# Patient Record
Sex: Female | Born: 1990 | Race: White | Hispanic: No | Marital: Married | Smoking: Never smoker
Health system: Southern US, Community
[De-identification: ages and names within clinical notes are randomized; demographics above are authoritative.]

## PROBLEM LIST (undated history)

## (undated) ENCOUNTER — Emergency Department: Admission: EM | Payer: Medicaid Other | Source: Home / Self Care

## (undated) DIAGNOSIS — K219 Gastro-esophageal reflux disease without esophagitis: Secondary | ICD-10-CM

## (undated) DIAGNOSIS — F32A Depression, unspecified: Secondary | ICD-10-CM

## (undated) DIAGNOSIS — E162 Hypoglycemia, unspecified: Secondary | ICD-10-CM

## (undated) DIAGNOSIS — J45909 Unspecified asthma, uncomplicated: Secondary | ICD-10-CM

## (undated) DIAGNOSIS — F329 Major depressive disorder, single episode, unspecified: Secondary | ICD-10-CM

## (undated) HISTORY — DX: Gastro-esophageal reflux disease without esophagitis: K21.9

## (undated) HISTORY — PX: TONSILLECTOMY AND ADENOIDECTOMY: SUR1326

## (undated) HISTORY — DX: Depression, unspecified: F32.A

## (undated) HISTORY — DX: Hypoglycemia, unspecified: E16.2

## (undated) HISTORY — DX: Unspecified asthma, uncomplicated: J45.909

---
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
## 2017-04-21 ENCOUNTER — Ambulatory Visit (INDEPENDENT_AMBULATORY_CARE_PROVIDER_SITE_OTHER): Payer: BLUE CROSS/BLUE SHIELD | Admitting: Advanced Practice Midwife

## 2017-04-21 ENCOUNTER — Encounter: Payer: Self-pay | Admitting: Advanced Practice Midwife

## 2017-04-21 ENCOUNTER — Encounter: Payer: BLUE CROSS/BLUE SHIELD | Admitting: Obstetrics and Gynecology

## 2017-04-21 VITALS — BP 100/60 | Ht 63.0 in | Wt 153.0 lb

## 2017-04-21 DIAGNOSIS — Z124 Encounter for screening for malignant neoplasm of cervix: Secondary | ICD-10-CM | POA: Diagnosis not present

## 2017-04-21 DIAGNOSIS — Z3401 Encounter for supervision of normal first pregnancy, first trimester: Secondary | ICD-10-CM | POA: Diagnosis not present

## 2017-04-21 DIAGNOSIS — Z113 Encounter for screening for infections with a predominantly sexual mode of transmission: Secondary | ICD-10-CM

## 2017-04-21 DIAGNOSIS — Z3400 Encounter for supervision of normal first pregnancy, unspecified trimester: Secondary | ICD-10-CM | POA: Insufficient documentation

## 2017-04-21 NOTE — Progress Notes (Signed)
C/o hypoglycemic - bs have been everywhere since preg. Eating a lot. Really thirsty and not using the bathroom a lot. Every day around 2:30 face flushes - thinks it's her blood sugar dropping. rj

## 2017-04-21 NOTE — Patient Instructions (Signed)
Prenatal Care WHAT IS PRENATAL CARE? Prenatal care is the process of caring for a pregnant woman before she gives birth. Prenatal care makes sure that she and her baby remain as healthy as possible throughout pregnancy. Prenatal care may be provided by a midwife, family practice health care provider, or a childbirth and pregnancy specialist (obstetrician). Prenatal care may include physical examinations, testing, treatments, and education on nutrition, lifestyle, and social support services. WHY IS PRENATAL CARE SO IMPORTANT? Early and consistent prenatal care increases the chance that you and your baby will remain healthy throughout your pregnancy. This type of care also decreases a baby's risk of being born too early (prematurely), or being born smaller than expected (small for gestational age). Any underlying medical conditions you may have that could pose a risk during your pregnancy are discussed during prenatal care visits. You will also be monitored regularly for any new conditions that may arise during your pregnancy so they can be treated quickly and effectively. WHAT HAPPENS DURING PRENATAL CARE VISITS? Prenatal care visits may include the following: Discussion Tell your health care provider about any new signs or symptoms you have experienced since your last visit. These might include:  Nausea or vomiting.  Increased or decreased level of energy.  Difficulty sleeping.  Back or leg pain.  Weight changes.  Frequent urination.  Shortness of breath with physical activity.  Changes in your skin, such as the development of a rash or itchiness.  Vaginal discharge or bleeding.  Feelings of excitement or nervousness.  Changes in your baby's movements.  You may want to write down any questions or topics you want to discuss with your health care provider and bring them with you to your appointment. Examination During your first prenatal care visit, you will likely have a complete  physical exam. Your health care provider will often examine your vagina, cervix, and the position of your uterus, as well as check your heart, lungs, and other body systems. As your pregnancy progresses, your health care provider will measure the size of your uterus and your baby's position inside your uterus. He or she may also examine you for early signs of labor. Your prenatal visits may also include checking your blood pressure and, after about 10-12 weeks of pregnancy, listening to your baby's heartbeat. Testing Regular testing often includes:  Urinalysis. This checks your urine for glucose, protein, or signs of infection.  Blood count. This checks the levels of white and red blood cells in your body.  Tests for sexually transmitted infections (STIs). Testing for STIs at the beginning of pregnancy is routinely done and is required in many states.  Antibody testing. You will be checked to see if you are immune to certain illnesses, such as rubella, that can affect a developing fetus.  Glucose screen. Around 24-28 weeks of pregnancy, your blood glucose level will be checked for signs of gestational diabetes. Follow-up tests may be recommended.  Group B strep. This is a bacteria that is commonly found inside a woman's vagina. This test will inform your health care provider if you need an antibiotic to reduce the amount of this bacteria in your body prior to labor and childbirth.  Ultrasound. Many pregnant women undergo an ultrasound screening around 18-20 weeks of pregnancy to evaluate the health of the fetus and check for any developmental abnormalities.  HIV (human immunodeficiency virus) testing. Early in your pregnancy, you will be screened for HIV. If you are at high risk for HIV, this test may   be repeated during your third trimester of pregnancy.  You may be offered other testing based on your age, personal or family medical history, or other factors. HOW OFTEN SHOULD I PLAN TO SEE MY  HEALTH CARE PROVIDER FOR PRENATAL CARE? Your prenatal care check-up schedule depends on any medical conditions you have before, or develop during, your pregnancy. If you do not have any underlying medical conditions, you will likely be seen for checkups:  Monthly, during the first 6 months of pregnancy.  Twice a month during months 7 and 8 of pregnancy.  Weekly starting in the 9th month of pregnancy and until delivery.  If you develop signs of early labor or other concerning signs or symptoms, you may need to see your health care provider more often. Ask your health care provider what prenatal care schedule is best for you. WHAT CAN I DO TO KEEP MYSELF AND MY BABY AS HEALTHY AS POSSIBLE DURING MY PREGNANCY?  Take a prenatal vitamin containing 400 micrograms (0.4 mg) of folic acid every day. Your health care provider may also ask you to take additional vitamins such as iodine, vitamin D, iron, copper, and zinc.  Take 1500-2000 mg of calcium daily starting at your 20th week of pregnancy until you deliver your baby.  Make sure you are up to date on your vaccinations. Unless directed otherwise by your health care provider: ? You should receive a tetanus, diphtheria, and pertussis (Tdap) vaccination between the 27th and 36th week of your pregnancy, regardless of when your last Tdap immunization occurred. This helps protect your baby from whooping cough (pertussis) after he or she is born. ? You should receive an annual inactivated influenza vaccine (IIV) to help protect you and your baby from influenza. This can be done at any point during your pregnancy.  Eat a well-rounded diet that includes: ? Fresh fruits and vegetables. ? Lean proteins. ? Calcium-rich foods such as milk, yogurt, hard cheeses, and dark, leafy greens. ? Whole grain breads.  Do noteat seafood high in mercury, including: ? Swordfish. ? Tilefish. ? Shark. ? King mackerel. ? More than 6 oz tuna per week.  Do not  eat: ? Raw or undercooked meats or eggs. ? Unpasteurized foods, such as soft cheeses (brie, blue, or feta), juices, and milks. ? Lunch meats. ? Hot dogs that have not been heated until they are steaming.  Drink enough water to keep your urine clear or pale yellow. For many women, this may be 10 or more 8 oz glasses of water each day. Keeping yourself hydrated helps deliver nutrients to your baby and may prevent the start of pre-term uterine contractions.  Do not use any tobacco products including cigarettes, chewing tobacco, or electronic cigarettes. If you need help quitting, ask your health care provider.  Do not drink beverages containing alcohol. No safe level of alcohol consumption during pregnancy has been determined.  Do not use any illegal drugs. These can harm your developing baby or cause a miscarriage.  Ask your health care provider or pharmacist before taking any prescription or over-the-counter medicines, herbs, or supplements.  Limit your caffeine intake to no more than 200 mg per day.  Exercise. Unless told otherwise by your health care provider, try to get 30 minutes of moderate exercise most days of the week. Do not  do high-impact activities, contact sports, or activities with a high risk of falling, such as horseback riding or downhill skiing.  Get plenty of rest.  Avoid anything that raises your   body temperature, such as hot tubs and saunas.  If you own a cat, do not empty its litter box. Bacteria contained in cat feces can cause an infection called toxoplasmosis. This can result in serious harm to the fetus.  Stay away from chemicals such as insecticides, lead, mercury, and cleaning or paint products that contain solvents.  Do not have any X-rays taken unless medically necessary.  Take a childbirth and breastfeeding preparation class. Ask your health care provider if you need a referral or recommendation.  This information is not intended to replace advice given  to you by your health care provider. Make sure you discuss any questions you have with your health care provider. Document Released: 03/26/2003 Document Revised: 08/26/2015 Document Reviewed: 06/07/2013 Elsevier Interactive Patient Education  2017 [employer] Exercise During Pregnancy For people of all ages, exercise is an important part of being healthy. Exercise improves heart and lung function and helps to maintain strength, flexibility, and a healthy body weight. Exercise also boosts energy levels and elevates mood. For most women, maintaining an exercise routine throughout pregnancy is recommended. It is only on rare occasions and with certain medical conditions or pregnancy complications that women may be asked to limit or avoid exercise during pregnancy. What are some other benefits to exercising during pregnancy? Along with maintaining strength and flexibility, exercising throughout pregnancy can help to:  Keep strength in muscles that are very important during labor and childbirth.  Decrease low back pain during pregnancy.  Decrease the risk of developing gestational diabetes mellitus (GDM).  Improve blood sugar (glucose) control for women who have GDM.  Decrease the risk of developing preeclampsia. This is a serious condition that causes high blood pressure along with other symptoms, such as swelling and headaches.  Decrease the risk of cesarean delivery.  Speed up the recovery after giving birth.  How often should I exercise? Unless your health care provider gives you different instructions, you should try to exercise on most days or all days of the week. In general, try to exercise with moderate intensity for about 150 minutes per week. This can be spread out across several days, such as exercising for 30 minutes per day on 5 days of each week. You can tell that you are exercising at a moderate intensity if you have a higher heart rate and faster breathing, but you are still  able to hold a conversation. What types of moderate-intensity exercise are recommended during pregnancy? There are many types of exercise that are safe for you to do during pregnancy. Unless your health care provider gives you different instructions, do a variety of exercises that safely increase your heart and breathing (cardiopulmonary) rates and help you to build and maintain muscle strength (strength training). You should always be able to talk in full sentences while exercising during pregnancy. Some examples of exercising that is safe to do during pregnancy include:  Brisk walking or hiking.  Swimming.  Water aerobics.  Riding a stationary bike.  Strength training.  Modified yoga or Pilates. Tell your instructor that you are pregnant. Avoid overstretching and avoid lying on your back for long periods of time.  Running or jogging. Only choose this type of exercise if: ? You ran or jogged regularly before your pregnancy. ? You can run or jog and still talk in complete sentences.  What types of exercise should I not do during pregnancy? Depending on your level of fitness and whether you exercised regularly before your pregnancy, you may be   advised to limit vigorous-intensity exercise during your pregnancy. You can tell that you are exercising at a vigorous intensity if you are breathing much harder and faster and cannot hold a conversation while exercising. Some examples of exercising that you should avoid during pregnancy include:  Contact sports.  Activities that place you at risk for falling on or being hit in the belly, such as downhill skiing, water skiing, surfing, rock climbing, cycling, gymnastics, and horseback riding.  Scuba diving.  Sky diving.  Yoga or Pilates in a room that is heated to extreme temperatures ("hot yoga" or "hot Pilates").  Jogging or running, unless you ran or jogged regularly before your pregnancy. While jogging or running, you should always be able  to talk in full sentences. Do not run or jog so vigorously that you are unable to have a conversation.  If you are not used to exercising at elevation (more than 6,000 feet above sea level), do not do so during your pregnancy.  When should I avoid exercising during pregnancy? Certain medical conditions can make it unsafe to exercise during pregnancy, or they may increase your risk of miscarriage or early labor and birth. Some of these conditions include:  Some types of heart disease.  Some types of lung disease.  Placenta previa. This is when the placenta partially or completely covers the opening of the uterus (cervix).  Frequent bleeding from the vagina during your pregnancy.  Incompetent cervix. This is when your cervix does not remain as tightly closed during pregnancy as it should.  Premature labor.  Ruptured membranes. This is when the protective sac (amniotic sac) opens up and amniotic fluid leaks from your vagina.  Severely low blood count (anemia).  Preeclampsia or pregnancy-caused high blood pressure.  Carrying more than one baby (multiple gestation) and having an additional risk of early labor.  Poorly controlled diabetes.  Being severely underweight or severely overweight.  Intrauterine growth restriction. This is when your baby's growth and development during pregnancy are slower than expected.  Other medical conditions. Ask your health care provider if any apply to you.  What else should I know about exercising during pregnancy? You should take these precautions while exercising during pregnancy:  Avoid overheating. ? Wear loose-fitting, breathable clothes. ? Do not exercise in very high temperatures.  Avoid dehydration. Drink enough water before, during, and after exercise to keep your urine clear or pale yellow.  Avoid overstretching. Because of hormone changes during pregnancy, it is easy to overstretch muscles, tendons, and ligaments during  pregnancy.  Start slowly and ask your health care provider to recommend types of exercise that are safe for you, if exercising regularly is new for you.  Pregnancy is not a time for exercising to lose weight. When should I seek medical care? You should stop exercising and call your health care provider if you have any unusual symptoms, such as:  Mild uterine contractions or abdominal cramping.  Dizziness that does not improve with rest.  When should I seek immediate medical care? You should stop exercising and call your local emergency services (911 in the [place] if you have any unusual symptoms, such as:  Sudden, severe pain in your low back or your belly.  Uterine contractions or abdominal cramping that do not improve with rest.  Chest pain.  Bleeding or fluid leaking from your vagina.  Shortness of breath.  This information is not intended to replace advice given to you by your health care provider. Make sure you discuss any   questions you have with your health care provider. Document Released: 03/23/2005 Document Revised: 08/21/2015 Document Reviewed: 05/31/2014 [employer] Interactive Patient Education  2018 [employer] Eating Plan for Pregnant Women While you are pregnant, your body will require additional nutrition to help support your growing baby. It is recommended that you consume:  150 additional calories each day during your first trimester.  300 additional calories each day during your second trimester.  300 additional calories each day during your third trimester.  Eating a healthy, well-balanced diet is very important for your health and for your baby's health. You also have a higher need for some vitamins and minerals, such as folic acid, calcium, iron, and vitamin D. What do I need to know about eating during pregnancy?  Do not try to lose weight or go on a diet during pregnancy.  Choose healthy, nutritious foods. Choose  of a sandwich with a glass of milk  instead of a candy bar or a high-calorie sugar-sweetened beverage.  Limit your overall intake of foods that have "empty calories." These are foods that have little nutritional value, such as sweets, desserts, candies, sugar-sweetened beverages, and fried foods.  Eat a variety of foods, especially fruits and vegetables.  Take a prenatal vitamin to help meet the additional needs during pregnancy, specifically for folic acid, iron, calcium, and vitamin D.  Remember to stay active. Ask your health care provider for exercise recommendations that are specific to you.  Practice good food safety and cleanliness, such as washing your hands before you eat and after you prepare raw meat. This helps to prevent foodborne illnesses, such as listeriosis, that can be very dangerous for your baby. Ask your health care provider for more information about listeriosis. What does 150 extra calories look like? Healthy options for an additional 150 calories each day could be any of the following:  Plain low-fat yogurt (6-8 oz) with  cup of berries.  1 apple with 2 teaspoons of peanut butter.  Cut-up vegetables with  cup of hummus.  Low-fat chocolate milk (8 oz or 1 cup).  1 string cheese with 1 medium orange.   of a peanut butter and jelly sandwich on whole-wheat bread (1 tsp of peanut butter).  For 300 calories, you could eat two of those healthy options each day. What is a healthy amount of weight to gain? The recommended amount of weight for you to gain is based on your pre-pregnancy BMI. If your pre-pregnancy BMI was:  Less than 18 (underweight), you should gain 28-40 lb.  18-24.9 (normal), you should gain 25-35 lb.  25-29.9 (overweight), you should gain 15-25 lb.  Greater than 30 (obese), you should gain 11-20 lb.  What if I am having twins or multiples? Generally, pregnant women who will be having twins or multiples may need to increase their daily calories by 300-600 calories each day. The  recommended range for total weight gain is 25-54 lb, depending on your pre-pregnancy BMI. Talk with your health care provider for specific guidance about additional nutritional needs, weight gain, and exercise during your pregnancy. What foods can I eat? Grains Any grains. Try to choose whole grains, such as whole-wheat bread, oatmeal, or brown rice. Vegetables Any vegetables. Try to eat a variety of colors and types of vegetables to get a full range of vitamins and minerals. Remember to wash your vegetables well before eating. Fruits Any fruits. Try to eat a variety of colors and types of fruit to get a full range of vitamins and   minerals. Remember to wash your fruits well before eating. Meats and Other Protein Sources Lean meats, including chicken, turkey, fish, and lean cuts of beef, veal, or pork. Make sure that all meats are cooked to "well done." Tofu. Tempeh. Beans. Eggs. Peanut butter and other nut butters. Seafood, such as shrimp, crab, and lobster. If you choose fish, select types that are higher in omega-3 fatty acids, including salmon, herring, mussels, trout, sardines, and pollock. Make sure that all meats are cooked to food-safe temperatures. Dairy Pasteurized milk and milk alternatives. Pasteurized yogurt and pasteurized cheese. Cottage cheese. Sour cream. Beverages Water. Juices that contain 100% fruit juice or vegetable juice. Caffeine-free teas and decaffeinated coffee. Drinks that contain caffeine are okay to drink, but it is better to avoid caffeine. Keep your total caffeine intake to less than 200 mg each day (12 oz of coffee, tea, or soda) or as directed by your health care provider. Condiments Any pasteurized condiments. Sweets and Desserts Any sweets and desserts. Fats and Oils Any fats and oils. The items listed above may not be a complete list of recommended foods or beverages. Contact your dietitian for more options. What foods are not  recommended? Vegetables Unpasteurized (raw) vegetable juices. Fruits Unpasteurized (raw) fruit juices. Meats and Other Protein Sources Cured meats that have nitrates, such as bacon, salami, and hotdogs. Luncheon meats, bologna, or other deli meats (unless they are reheated until they are steaming hot). Refrigerated pate, meat spreads from a meat counter, smoked seafood that is found in the refrigerated section of a store. Raw fish, such as sushi or sashimi. High mercury content fish, such as tilefish, shark, swordfish, and king mackerel. Raw meats, such as tuna or beef tartare. Undercooked meats and poultry. Make sure that all meats are cooked to food-safe temperatures. Dairy Unpasteurized (raw) milk and any foods that have raw milk in them. Soft cheeses, such as feta, queso blanco, queso fresco, Brie, Camembert cheeses, blue-veined cheeses, and Panela cheese (unless it is made with pasteurized milk, which must be stated on the label). Beverages Alcohol. Sugar-sweetened beverages, such as sodas, teas, or energy drinks. Condiments Homemade fermented foods and drinks, such as pickles, sauerkraut, or kombucha drinks. (Store-bought pasteurized versions of these are okay.) Other Salads that are made in the store, such as ham salad, chicken salad, egg salad, tuna salad, and seafood salad. The items listed above may not be a complete list of foods and beverages to avoid. Contact your dietitian for more information. This information is not intended to replace advice given to you by your health care provider. Make sure you discuss any questions you have with your health care provider. Document Released: 01/05/2014 Document Revised: 08/29/2015 Document Reviewed: 09/05/2013 [employer] Interactive Patient Education  2018 [employer]  

## 2017-04-22 LAB — RPR+RH+ABO+RUB AB+AB SCR+CB...
Antibody Screen: NEGATIVE
HEMATOCRIT: 37.7 % (ref 34.0–46.6)
HEP B S AG: NEGATIVE
HIV SCREEN 4TH GENERATION: NONREACTIVE
Hemoglobin: 12.1 g/dL (ref 11.1–15.9)
MCH: 27.5 pg (ref 26.6–33.0)
MCHC: 32.1 g/dL (ref 31.5–35.7)
MCV: 86 fL (ref 79–97)
PLATELETS: 160 10*3/uL (ref 150–379)
RBC: 4.4 x10E6/uL (ref 3.77–5.28)
RDW: 13.2 % (ref 12.3–15.4)
RH TYPE: POSITIVE
RPR: NONREACTIVE
Rubella Antibodies, IGG: 7.06 index (ref >0.99)
Varicella zoster IgG: 3033 index (ref >165)
WBC: 9.5 10*3/uL (ref 3.4–10.8)

## 2017-04-22 LAB — URINE CULTURE

## 2017-04-22 NOTE — Progress Notes (Signed)
New Obstetric Patient H&P    Chief Complaint: "Desires prenatal care"   History of Present Illness: Patient is a 27 y.o. G1P0 Not Hispanic or Latino female, LMP 03/04/2017 presents with amenorrhea and positive home pregnancy test. Based on her LMP, her EDD is Estimated Date of Delivery: 12/09/17 and her EGA is [redacted]w[redacted]d. Cycles are 6. days, regular, and occur approximately every : 28 days. She has never had a PAP smear.   She had a urine pregnancy test which was positive 2 week(s)  ago. Her last menstrual period was normal and lasted for  5 or 6 day(s). Since her LMP she claims she has experienced breast tenderness, fatigue, nausea. She denies vaginal bleeding. Her past medical history is contributory for asthma and hypoglycemia. This is her first pregnancy.  Since her LMP, she admits to the use of tobacco products  no She claims she has gained  5 pounds since the start of her pregnancy.  There are cats in the home in the home  no  She admits close contact with children on a regular basis  no  She has had chicken pox in the past -she was vaccinated She has had Tuberculosis exposures, symptoms, or previously tested positive for TB   no Current or past history of domestic violence. no  Genetic Screening/Teratology Counseling: (Includes patient, baby's father, or anyone in either family with:)   1. Patient's age >/= 65 at [facility]  no 2. Thalassemia ([place], [place], Mediterranean, or Asian background): MCV<80  no 3. Neural tube defect (meningomyelocele, spina bifida, anencephaly)  no 4. Congenital heart defect  no  5. Down syndrome  no 6. Tay-Sachs (Jewish, [place]  no 7. Canavan's Disease  no 8. Sickle cell disease or trait (African)  no  9. Hemophilia or other blood disorders  no  10. Muscular dystrophy  no  11. Cystic fibrosis  no  12. Huntington's Chorea  no  13. Mental retardation/autism  no 14. Other inherited genetic or chromosomal disorder  no 15. Maternal metabolic disorder  (DM, PKU, etc)  no 16. Patient or FOB with a child with a birth defect not listed above no  16a. Patient or FOB with a birth defect themselves no 17. Recurrent pregnancy loss, or stillbirth  no  18. Any medications since LMP other than prenatal vitamins (include vitamins, supplements, OTC meds, drugs, alcohol)  Asthma, allergy and GERD medications 19. Any other genetic/environmental exposure to discuss  no  Infection History:   1. Lives with someone with TB or TB exposed  no  2. Patient or partner has history of genital herpes  no 3. Rash or viral illness since LMP  no 4. History of STI (GC, CT, HPV, syphilis, HIV)  no 5. History of recent travel :  no  Other pertinent information:  no     Review of Systems:10 point review of systems negative unless otherwise noted in HPI  Past Medical History:  Past Medical History:  Diagnosis Date  . Acid reflux   . Asthma   . Hypoglycemia     Past Surgical History:  Past Surgical History:  Procedure Laterality Date  . TONSILLECTOMY AND ADENOIDECTOMY    . WISDOM TOOTH EXTRACTION  2013    Gynecologic History: Patient's last menstrual period was 03/04/2017 (approximate).  Obstetric History: G1P0  Family History:  Family History  Problem Relation Age of Onset  . Cancer Mother 20       thyroid  . Thyroid disease Mother   .  Cancer Father [id number]       appendix - ca caused them to rupture  . Cancer Paternal Grandmother 4160       cervical    Social History:  Social History   Socioeconomic History  . Marital status: Married    Spouse name: Not on file  . Number of children: 0  . Years of education: 6314  . Highest education level: Not on file  Social Needs  . Financial resource strain: Not on file  . Food insecurity - worry: Not on file  . Food insecurity - inability: Not on file  . Transportation needs - medical: Not on file  . Transportation needs - non-medical: Not on file  Occupational History  . Occupation: food and minor  maintenance    Employer: [employer]  Tobacco Use  . Smoking status: Never Smoker  . Smokeless tobacco: Never Used  Substance and Sexual Activity  . Alcohol use: Yes    Comment: not c preg  . Drug use: No  . Sexual activity: Yes    Birth control/protection: None  Other Topics Concern  . Not on file  Social History Narrative  . Not on file    Allergies:  Allergies  Allergen Reactions  . Other     SEASONAL  . Fluoxetine Rash    Medications: Prior to Admission medications   Medication Sig Start Date End Date Taking? Authorizing Provider  albuterol (PROAIR HFA) 108 (90 Base) MCG/ACT inhaler Inhale 2 puffs into the lungs daily as needed.   Yes [provider]  desloratadine (CLARINEX) 5 MG tablet Take 1 tablet by mouth daily. 02/06/17  Yes [provider]  montelukast (SINGULAIR) 10 MG tablet Take 1 tablet by mouth daily. 02/06/17  Yes [provider]  omeprazole (PRILOSEC) 40 MG capsule Take 1 capsule by mouth 2 (two) times daily.   Yes [provider]  Prenatal Vit-Fe Fumarate-FA (MULTIVITAMIN-PRENATAL) 27-0.8 MG TABS tablet Take 1 tablet by mouth daily at 12 noon.   Yes [provider]    Physical Exam Vitals: Blood pressure 100/60, height 5\' 3"  (1.6 m), weight 153 lb (69.4 kg), last menstrual period 03/04/2017.  General: NAD HEENT: normocephalic, anicteric Thyroid: no enlargement, no palpable nodules Pulmonary: No increased work of breathing, CTAB Cardiovascular: RRR, distal pulses 2+ Abdomen: NABS, soft, non-tender, non-distended.  Umbilicus without lesions.  No hepatomegaly, splenomegaly or masses palpable. No evidence of hernia  Genitourinary:  External: Normal external female genitalia.  Normal urethral meatus, normal  Bartholin's and Skene's glands.    Vagina: Normal vaginal mucosa, no evidence of prolapse.    Cervix: Grossly normal in appearance, no bleeding, no CMT  Uterus: Enlarged, mobile, normal contour.    Adnexa:  ovaries non-enlarged, no adnexal masses  Rectal: deferred Extremities: no edema, erythema, or tenderness Neurologic: Grossly intact Psychiatric: mood appropriate, affect full   Assessment: 27 y.o. G1P0 at 2334w0d presenting to initiate prenatal care  Plan: 1) Avoid alcoholic beverages. 2) Patient encouraged not to smoke.  3) Discontinue the use of all non-medicinal drugs and chemicals. Discussed discontinuing use of Omeprazole (Category C) at least during 1st trimester and using alternative ant-acid tx. 4) Take prenatal vitamins daily.  5) Nutrition, food safety (fish, cheese advisories, and high nitrite foods) and exercise discussed. 6) Hospital and practice style discussed with cross coverage system.  7) Genetic Screening, such as with 1st Trimester Screening, cell free fetal DNA, AFP testing, and Ultrasound, as well as with amniocentesis and CVS as appropriate, is  discussed with patient. At the conclusion of today's visit patient declined genetic testing 8) Patient is asked about travel to areas at risk for the [place] virus, and counseled to avoid travel and exposure to mosquitoes or sexual partners who may have themselves been exposed to the virus. Testing is discussed, and will be ordered as appropriate.    Tresea Mall, CNM

## 2017-04-24 LAB — IGP,CTNGTV,RFX APTIMA HPV ASCU
Chlamydia, Nuc. Acid Amp: NEGATIVE
GONOCOCCUS, NUC. ACID AMP: NEGATIVE
PAP Smear Comment: 0
Trich vag by NAA: NEGATIVE

## 2017-04-28 ENCOUNTER — Other Ambulatory Visit: Payer: Self-pay | Admitting: Advanced Practice Midwife

## 2017-04-28 ENCOUNTER — Ambulatory Visit (INDEPENDENT_AMBULATORY_CARE_PROVIDER_SITE_OTHER): Payer: BLUE CROSS/BLUE SHIELD

## 2017-04-28 ENCOUNTER — Ambulatory Visit (INDEPENDENT_AMBULATORY_CARE_PROVIDER_SITE_OTHER): Payer: BLUE CROSS/BLUE SHIELD | Admitting: Obstetrics & Gynecology

## 2017-04-28 VITALS — BP 100/70 | Wt 153.0 lb

## 2017-04-28 DIAGNOSIS — O219 Vomiting of pregnancy, unspecified: Secondary | ICD-10-CM

## 2017-04-28 DIAGNOSIS — N926 Irregular menstruation, unspecified: Secondary | ICD-10-CM

## 2017-04-28 DIAGNOSIS — Z362 Encounter for other antenatal screening follow-up: Secondary | ICD-10-CM | POA: Diagnosis not present

## 2017-04-28 DIAGNOSIS — Z3400 Encounter for supervision of normal first pregnancy, unspecified trimester: Secondary | ICD-10-CM | POA: Diagnosis not present

## 2017-04-28 DIAGNOSIS — O200 Threatened abortion: Secondary | ICD-10-CM

## 2017-04-28 NOTE — Progress Notes (Signed)
Review of ULTRASOUND.    [facility] and [facility]    Plan of management discussed with patient.  Gest sac, Yolk sac, no CRL or FHT Discussed possibilities.  Repeat US one week. Monitor for bleeding, pain. Nausea mild; cont PNV as well.  Annamarie MajorPaul Harris, MD, Merlinda FrederickFACOG [facility], [facility] 04/28/2017  3:16 PM

## 2017-04-30 ENCOUNTER — Telehealth: Payer: Self-pay | Admitting: Maternal Newborn

## 2017-04-30 ENCOUNTER — Emergency Department: Payer: BLUE CROSS/BLUE SHIELD

## 2017-04-30 ENCOUNTER — Other Ambulatory Visit: Payer: Self-pay

## 2017-04-30 ENCOUNTER — Emergency Department
Admission: EM | Admit: 2017-04-30 | Discharge: 2017-04-30 | Disposition: A | Discharge status: Home / Self Care | Payer: BLUE CROSS/BLUE SHIELD | Attending: Emergency Medicine | Admitting: Emergency Medicine

## 2017-04-30 ENCOUNTER — Encounter: Payer: Self-pay | Admitting: Emergency Medicine

## 2017-04-30 DIAGNOSIS — J45909 Unspecified asthma, uncomplicated: Secondary | ICD-10-CM | POA: Diagnosis not present

## 2017-04-30 DIAGNOSIS — O23599 Infection of other part of genital tract in pregnancy, unspecified trimester: Secondary | ICD-10-CM | POA: Diagnosis not present

## 2017-04-30 DIAGNOSIS — O209 Hemorrhage in early pregnancy, unspecified: Secondary | ICD-10-CM | POA: Diagnosis present

## 2017-04-30 DIAGNOSIS — Z79899 Other long term (current) drug therapy: Secondary | ICD-10-CM | POA: Diagnosis not present

## 2017-04-30 DIAGNOSIS — Z3A01 Less than 8 weeks gestation of pregnancy: Secondary | ICD-10-CM | POA: Insufficient documentation

## 2017-04-30 DIAGNOSIS — B9689 Other specified bacterial agents as the cause of diseases classified elsewhere: Secondary | ICD-10-CM

## 2017-04-30 DIAGNOSIS — N760 Acute vaginitis: Secondary | ICD-10-CM

## 2017-04-30 DIAGNOSIS — O200 Threatened abortion: Secondary | ICD-10-CM | POA: Diagnosis not present

## 2017-04-30 LAB — BASIC METABOLIC PANEL
ANION GAP: 9 (ref 5–15)
BUN: 11 mg/dL (ref 6–20)
CHLORIDE: 109 mmol/L (ref 101–111)
CO2: 22 mmol/L (ref 22–32)
Calcium: 9 mg/dL (ref 8.9–10.3)
Creatinine, Ser: 0.51 mg/dL (ref 0.44–1.00)
Glucose, Bld: 87 mg/dL (ref 65–99)
POTASSIUM: 4 mmol/L (ref 3.5–5.1)
SODIUM: 140 mmol/L (ref 135–145)

## 2017-04-30 LAB — CBC
HEMATOCRIT: 40.7 % (ref 35.0–47.0)
HEMOGLOBIN: 13.4 g/dL (ref 12.0–16.0)
MCH: 28.4 pg (ref 26.0–34.0)
MCHC: 32.8 g/dL (ref 32.0–36.0)
MCV: 86.4 fL (ref 80.0–100.0)
Platelets: 165 10*3/uL (ref 150–440)
RBC: 4.71 MIL/uL (ref 3.80–5.20)
RDW: 13.1 % (ref 11.5–14.5)
WBC: 12 10*3/uL — AB (ref 3.6–11.0)

## 2017-04-30 LAB — WET PREP, GENITAL
Sperm: NONE SEEN
Trich, Wet Prep: NONE SEEN
Yeast Wet Prep HPF POC: NONE SEEN

## 2017-04-30 LAB — CHLAMYDIA/NGC RT PCR (ARMC ONLY)
CHLAMYDIA TR: NOT DETECTED
N GONORRHOEAE: NOT DETECTED

## 2017-04-30 LAB — ABO/RH: ABO/RH(D): A POS

## 2017-04-30 LAB — HCG, QUANTITATIVE, PREGNANCY: hCG, Beta Chain, Quant, S: 1154 m[IU]/mL — ABNORMAL HIGH (ref <5)

## 2017-04-30 MED ORDER — METRONIDAZOLE 500 MG PO TABS: 500.0000 mg | ORAL_TABLET | Freq: Two times a day (BID) | ORAL | 0 refills | Status: DC

## 2017-04-30 NOTE — ED Triage Notes (Signed)
[redacted] week pregnant.  [facility] told her to come in due to bleeding and mid pelvic pain

## 2017-04-30 NOTE — ED Notes (Signed)
Patient transported to Ultrasound 

## 2017-04-30 NOTE — ED Notes (Signed)
Pelvic cart set up at bedside  

## 2017-04-30 NOTE — ED Triage Notes (Addendum)
First Nurse Note:  Arrives with C/O vaginal bleeding.  States initially started last Saturday, but became heavier yesterday and this morning.  Patient is AAOx3.  Skin warm and dry. NAD.  G1 P0. LMP:  03/04/17.

## 2017-04-30 NOTE — Telephone Encounter (Signed)
Pt is requesting the lab for HCG due to heavy bleeding. Pt reports soaking through pads. Per Filbert BertholdJC Schmid to advise pt to ER due to Heavy bleeding. Please advise scheduling Labs

## 2017-04-30 NOTE — ED Provider Notes (Signed)
[facility] Emergency Department Provider Note  ____________________________________________  Time seen: Approximately 12:16 PM  I have reviewed the triage vital signs and the nursing notes.   HISTORY  Chief Complaint Abdominal Pain    HPI Amanda Fisher is a 27 y.o. female G1 P0 approximately [redacted] weeks pregnant presenting with vaginal bleeding.  The patient reports that 6 days ago, she developed some mild vaginal spotting.  She underwent OB evaluation and ultrasound 2 days ago which showed a gestational sac.  Today, the patient has had increasing vaginal bleeding, now as heavy as a period without any clots.  She has had associated suprapubic cramping.  She has not had any lightheadedness, shortness of breath or syncope.  No fevers.  No change in vaginal discharge.   Past Medical History:  Diagnosis Date  . Acid reflux   . Asthma   . Hypoglycemia     Patient Active Problem List   Diagnosis Date Noted  . Supervision of normal first pregnancy, antepartum 04/21/2017    Past Surgical History:  Procedure Laterality Date  . TONSILLECTOMY AND ADENOIDECTOMY    . WISDOM TOOTH EXTRACTION  2013    Current Outpatient Rx  . Order #: [id number] Class: Historical Med  . Order #: [id number] Class: Historical Med  . Order #: [id number] Class: Historical Med  . Order #: [id number] Class: Historical Med  . Order #: [id number] Class: Historical Med  . Order #: [id number] Class: Print    Allergies Other and Fluoxetine  Family History  Problem Relation Age of Onset  . Cancer Mother 20       thyroid  . Thyroid disease Mother   . Cancer Father 75       appendix - ca caused them to rupture  . Cancer Paternal Grandmother 19       cervical    Social History Social History   Tobacco Use  . Smoking status: Never Smoker  . Smokeless tobacco: Never Used  Substance Use Topics  . Alcohol use: Yes    Comment: not c preg  . Drug use: No    Review of  Systems Constitutional: No fever/chills.  No lightheadedness or syncope. Eyes: No visual changes. ENT: No sore throat. No congestion or rhinorrhea. Cardiovascular: Denies chest pain. Denies palpitations. Respiratory: Denies shortness of breath.  No cough. Gastrointestinal: No abdominal pain.  No nausea, no vomiting.  No diarrhea.  No constipation. Genitourinary: Negative for dysuria.  Positive pelvic pain.  Positive vaginal bleeding.  Negative change in vaginal discharge. Musculoskeletal: Negative for back pain. Skin: Negative for rash. Neurological: Negative for headaches. No focal numbness, tingling or weakness.     ____________________________________________   PHYSICAL EXAM:  VITAL SIGNS: ED Triage Vitals  Enc Vitals Group     BP 04/30/17 1115 112/68     Pulse Rate 04/30/17 1115 66     Resp 04/30/17 1115 18     Temp 04/30/17 1115 98.4 F (36.9 C)     Temp Source 04/30/17 1115 Oral     SpO2 04/30/17 1115 100 %     Weight 04/30/17 1116 153 lb (69.4 kg)     Height 04/30/17 1116 5\' 3"  (1.6 m)     Head Circumference --      Peak Flow --      Pain Score 04/30/17 1126 4     Pain Loc --      Pain Edu? --      Excl. in GC? --     Constitutional: Alert and oriented. Well  appearing and in no acute distress. Answers questions appropriately. Eyes: Conjunctivae are normal and without pallor.  EOMI. No scleral icterus. Head: Atraumatic. Nose: No congestion/rhinnorhea. Mouth/Throat: Mucous membranes are moist.  Neck: No stridor.  Supple.   Cardiovascular: Normal rate, regular rhythm. No murmurs, rubs or gallops.  Respiratory: Normal respiratory effort.  No accessory muscle use or retractions. Lungs CTAB.  No wheezes, rales or ronchi. Gastrointestinal: Soft, and nondistended.  Tender to palpation in the suprapubic region peer no guarding or rebound.  No peritoneal signs. Genitourinary: Normal-appearing external genitalia without lesions. Vaginal exam with moderate bleeding and no  discharge,, normal-appearing cervix, normal vaginal wall tissue. Bimanual exam is negative for CMT, positive for suprapubic tenderness to palpation, adnexal tenderness to palpation, no palpable masses.  Cervix is open  musculoskeletal: No LE edema.  Neurologic:  A&Ox3.  Speech is clear.  Face and smile are symmetric.  EOMI.  Moves all extremities well. Skin:  Skin is warm, dry and intact. No rash noted. Psychiatric: Mood and affect are normal. Speech and behavior are normal.  Normal judgement.  ____________________________________________   LABS (all labs ordered are listed, but only abnormal results are displayed)  Labs Reviewed  WET PREP, GENITAL - Abnormal; Notable for the following components:      Result Value   Clue Cells Wet Prep HPF POC PRESENT (*)    WBC, Wet Prep HPF POC FEW (*)    All other components within normal limits  HCG, QUANTITATIVE, PREGNANCY - Abnormal; Notable for the following components:   hCG, Beta Chain, Quant, S 1,154 (*)    All other components within normal limits  CBC - Abnormal; Notable for the following components:   WBC 12.0 (*)    All other components within normal limits  CHLAMYDIA/NGC RT PCR ([facility] ONLY)  BASIC METABOLIC PANEL  URINALYSIS, COMPLETE (UACMP) WITH MICROSCOPIC  POC URINE PREG, ED  ABO/RH   ____________________________________________  EKG  Not indicated ____________________________________________  RADIOLOGY  [place] Ob Comp Less 14 Wks  Result Date: 04/30/2017 CLINICAL DATA:  Vaginal bleeding for 6 days with increasing severity. First-trimester pregnancy EXAM: OBSTETRIC <14 WK US AND TRANSVAGINAL OB US TECHNIQUE: Both transabdominal and transvaginal ultrasound examinations were performed for complete evaluation of the gestation as well as the maternal uterus, adnexal regions, and pelvic cul-de-sac. Transvaginal technique was performed to assess early pregnancy. COMPARISON:  04/28/2017 FINDINGS: Intrauterine gestational sac:  Single. Borderline lower position of the gestational sac compared to prior, but not definitely abnormal. Yolk sac: Visualized. Yolk sac has decreased in size from 5 mm to 3 mm today. Embryo:  Not Visualized. MSD: 6.6  mm   5 w   3  d Subchorionic hemorrhage:  None visualized. Maternal uterus/adnexae: Normal. IMPRESSION: Intrauterine pregnancy with mean sac diameter measuring 5 weeks 3 days. There is a yolk sac (which has decreased in size compared to study 2 days ago) without visible fetus. Recommend continued follow-up. Electronically Signed   By: Marnee SpringJonathon  Watts M.D.   On: 04/30/2017 13:52   [place] Ob Transvaginal  Result Date: 04/30/2017 CLINICAL DATA:  Vaginal bleeding for 6 days with increasing severity. First-trimester pregnancy EXAM: OBSTETRIC <14 WK US AND TRANSVAGINAL OB US TECHNIQUE: Both transabdominal and transvaginal ultrasound examinations were performed for complete evaluation of the gestation as well as the maternal uterus, adnexal regions, and pelvic cul-de-sac. Transvaginal technique was performed to assess early pregnancy. COMPARISON:  04/28/2017 FINDINGS: Intrauterine gestational sac: Single. Borderline lower position of the gestational sac compared to prior, but  not definitely abnormal. Yolk sac: Visualized. Yolk sac has decreased in size from 5 mm to 3 mm today. Embryo:  Not Visualized. MSD: 6.6  mm   5 w   3  d Subchorionic hemorrhage:  None visualized. Maternal uterus/adnexae: Normal. IMPRESSION: Intrauterine pregnancy with mean sac diameter measuring 5 weeks 3 days. There is a yolk sac (which has decreased in size compared to study 2 days ago) without visible fetus. Recommend continued follow-up. Electronically Signed   By: Marnee Spring M.D.   On: 04/30/2017 13:52    ____________________________________________   PROCEDURES  Procedure(s) performed: None  Procedures  Critical Care performed: No ____________________________________________   INITIAL IMPRESSION / ASSESSMENT  AND PLAN / ED COURSE  Pertinent labs & imaging results that were available during my care of the patient were reviewed by me and considered in my medical decision making (see chart for details).  27 y.o. female G1 P0 approximate [redacted] weeks pregnant presenting with progressively worsening vaginal bleeding, now with suprapubic cramping.  The patient has no signs or symptoms that are consistent returning for severe anemia or hypovolemia.  We will evaluate her for first trimester bleeding, threatened miscarriage.  She does have a recent ultrasound with intrauterine gestational sac so ectopic pregnancy would be very unlikely because it would be a heterotopic ectopic.  Plan reevaluation for final disposition.  ----------------------------------------- 1:59 PM on 04/30/2017 -----------------------------------------  The patient continues to be hemodynamically stable and has been clinically treated for her cramping.  Her wet prep does show clue cells and I will plan to discharge her home with a prescription for treatment.  Her hCG today is 1154, and her ultrasound shows a intrauterine gestational sac and a smaller than prior yolk sac.  No fetus is seen.  No abnormalities in the adnexa. The pt is Rh positive so Rhogam is not indicated. The patient will be discharged home with precautions for threatened miscarriage.  I spoken to her OB/GYN at [facility], Dr. Tiburcio Pea, who will see the patient in clinic.  At this time the patient is safe for discharge.  ____________________________________________  FINAL CLINICAL IMPRESSION(S) / ED DIAGNOSES  Final diagnoses:  Bacterial vaginosis  Threatened abortion in first trimester         NEW MEDICATIONS STARTED DURING THIS VISIT:  New Prescriptions   METRONIDAZOLE (FLAGYL) 500 MG TABLET    Take 1 tablet (500 mg total) by mouth 2 (two) times daily.      Rockne Menghini, MD 04/30/17 [phone]

## 2017-04-30 NOTE — ED Notes (Signed)
Lab notified to add on blood specimens.

## 2017-04-30 NOTE — Discharge Instructions (Addendum)
Please take the entire course of antibiotics, even if you are feeling better.  Please make a follow-up appoint with your OB/GYN.  Return to the emergency department for severe pain, increased bleeding, lightheadedness or fainting, fever, or any other symptoms concerning to you.

## 2017-05-03 ENCOUNTER — Telehealth: Payer: Self-pay

## 2017-05-03 NOTE — Telephone Encounter (Signed)
Pt is changing pads every 4-5 hours, bleeding is a little slower today.  Adv she may be on the downhill slope.  Adv if saturating a pad q9830min to 1hr to go to ED.  Pt voices understanding.

## 2017-05-03 NOTE — Telephone Encounter (Signed)
Pt calling, recently miscarried.  How much is she supposed to be bleeding after tissue has passed.  Still heavy bleeding.  [phone].  Mailbox is full.

## 2017-05-06 ENCOUNTER — Other Ambulatory Visit: Payer: BLUE CROSS/BLUE SHIELD

## 2017-05-06 ENCOUNTER — Encounter: Payer: BLUE CROSS/BLUE SHIELD | Admitting: Obstetrics and Gynecology

## 2017-06-18 ENCOUNTER — Encounter: Payer: Self-pay | Admitting: *Deleted

## 2017-06-18 ENCOUNTER — Ambulatory Visit
Admission: EM | Admit: 2017-06-18 | Discharge: 2017-06-18 | Disposition: A | Discharge status: Home / Self Care | Payer: BLUE CROSS/BLUE SHIELD | Attending: Family Medicine | Admitting: Family Medicine

## 2017-06-18 DIAGNOSIS — R5383 Other fatigue: Secondary | ICD-10-CM

## 2017-06-18 DIAGNOSIS — R0602 Shortness of breath: Secondary | ICD-10-CM

## 2017-06-18 MED ORDER — PREDNISONE 50 MG PO TABS: ORAL_TABLET | ORAL | 0 refills | Status: DC

## 2017-06-18 NOTE — ED Triage Notes (Signed)
Short of breath and chest congestion since yesterday. Hx of asthma and has used her inhalers several times through last night.

## 2017-06-18 NOTE — ED Provider Notes (Signed)
[facility]    CSN: [id number] Arrival date & time: 06/18/17  0910  History   Chief Complaint Chief Complaint  Patient presents with  . Shortness of Breath   HPI   27 year old female presents with shortness of breath.  Patient has a known history of asthma.  Additionally, patient was recently pregnant and had a miscarriage.  Patient reports that she developed shortness of breath yesterday.  She describes it as chest tightness.  She reports associated fatigue, and facial flushing.  No fever.  She used her inhaler without improvement.  No recent long travel.  No reports of palpitations.  No known inciting factor.  No known exacerbating relieving factors.  No other associated symptoms.  No other complaints this time.  Past Medical History:  Diagnosis Date  . Acid reflux   . Asthma   . Hypoglycemia    Past Surgical History:  Procedure Laterality Date  . TONSILLECTOMY AND ADENOIDECTOMY    . WISDOM TOOTH EXTRACTION  2013    OB History    Gravida Para Term Preterm AB Living   1             SAB TAB Ectopic Multiple Live Births                 Home Medications    Prior to Admission medications   Medication Sig Start Date End Date Taking? Authorizing Provider  albuterol (PROAIR HFA) 108 (90 Base) MCG/ACT inhaler Inhale 2 puffs into the lungs daily as needed.   Yes [provider]  montelukast (SINGULAIR) 10 MG tablet Take 1 tablet by mouth daily. 02/06/17  Yes [provider]  desloratadine (CLARINEX) 5 MG tablet Take 1 tablet by mouth daily. 02/06/17   [provider]  fluticasone (FLONASE) 50 MCG/ACT nasal spray Place 1 spray into both nostrils daily.    [provider]  metroNIDAZOLE (FLAGYL) 500 MG tablet Take 1 tablet (500 mg total) by mouth 2 (two) times daily. 04/30/17   Rockne MenghiniNorman, Anne-Caroline, MD  predniSONE (DELTASONE) 50 MG tablet 1 tablet daily x 5 days. 06/18/17   Tommie Samsook, Kaydance Bowie G, DO  Prenatal Vit-Fe Fumarate-FA  (MULTIVITAMIN-PRENATAL) 27-0.8 MG TABS tablet Take 1 tablet by mouth daily at 12 noon.    [provider]    Family History Family History  Problem Relation Age of Onset  . Cancer Mother 20       thyroid  . Thyroid disease Mother   . Hypertension Mother   . Cancer Father 6356       appendix - ca caused them to rupture  . Hypertension Father   . Cancer Paternal Grandmother 7460       cervical    Social History Social History   Tobacco Use  . Smoking status: Never Smoker  . Smokeless tobacco: Never Used  Substance Use Topics  . Alcohol use: Yes    Comment: not c preg  . Drug use: No     Allergies   Other and Fluoxetine   Review of Systems Review of Systems  Constitutional: Positive for fatigue. Negative for fever.  Respiratory: Positive for shortness of breath.    Physical Exam Triage Vital Signs ED Triage Vitals  Enc Vitals Group     BP 06/18/17 1005 (!) 118/54     Pulse Rate 06/18/17 1005 63     Resp 06/18/17 1005 16     Temp 06/18/17 1005 98.3 F (36.8 C)     Temp Source 06/18/17 1005  Oral     SpO2 06/18/17 1005 100 %     Weight 06/18/17 1007 148 lb (67.1 kg)     Height 06/18/17 1007 5\' 3"  (1.6 m)     Head Circumference --      Peak Flow --      Pain Score --      Pain Loc --      Pain Edu? --      Excl. in GC? --    Updated Vital Signs BP (!) 118/54 (BP Location: Left Arm)   Pulse 63   Temp 98.3 F (36.8 C) (Oral)   Resp 16   Ht 5\' 3"  (1.6 m)   Wt 148 lb (67.1 kg)   LMP 03/04/2017 (Approximate)   SpO2 100%   BMI 26.22 kg/m   Physical Exam  Constitutional: She is oriented to person, place, and time. She appears well-developed. No distress.  HENT:  Head: Normocephalic and atraumatic.  Mouth/Throat: Oropharynx is clear and moist.  Cardiovascular: Normal rate and regular rhythm.  No murmur heard. Pulmonary/Chest: Effort normal and breath sounds normal. She has no wheezes. She has no rales.  Neurological: She is alert and oriented to  person, place, and time.  Psychiatric: She has a normal mood and affect. Her behavior is normal.  Nursing note and vitals reviewed.  UC Treatments / Results  Labs (all labs ordered are listed, but only abnormal results are displayed) Labs Reviewed - No data to display  EKG  EKG Interpretation None       Radiology No results found.  Procedures Procedures (including critical care time)  Medications Ordered in UC Medications - No data to display   Initial Impression / Assessment and Plan / UC Course  I have reviewed the triage vital signs and the nursing notes.  Pertinent labs & imaging results that were available during my care of the patient were reviewed by me and considered in my medical decision making (see chart for details).     27 year old female presents with shortness of breath.  Exam unremarkable.  No tachycardia.  No hypoxia.  PERC negative.  Advised continued use of albuterol.  Short burst of prednisone.  Supportive care.  Final Clinical Impressions(s) / UC Diagnoses   Final diagnoses:  SOB (shortness of breath)    ED Discharge Orders        Ordered    predniSONE (DELTASONE) 50 MG tablet     06/18/17 1040     Controlled Substance Prescriptions [place] Controlled Substance Registry consulted? Not Applicable   Tommie Sams, DO 06/18/17 1114

## 2017-06-18 NOTE — Discharge Instructions (Signed)
Rest.  Prednisone as prescribed.  Inhaler as needed.  Take care  Dr. Adriana Simasook

## 2017-10-04 ENCOUNTER — Other Ambulatory Visit: Payer: Self-pay

## 2017-10-04 ENCOUNTER — Ambulatory Visit
Admission: EM | Admit: 2017-10-04 | Discharge: 2017-10-04 | Disposition: A | Discharge status: Home / Self Care | Payer: BLUE CROSS/BLUE SHIELD | Attending: Family Medicine | Admitting: Family Medicine

## 2017-10-04 DIAGNOSIS — R233 Spontaneous ecchymoses: Secondary | ICD-10-CM

## 2017-10-04 DIAGNOSIS — S20219A Contusion of unspecified front wall of thorax, initial encounter: Secondary | ICD-10-CM | POA: Diagnosis not present

## 2017-10-04 DIAGNOSIS — R238 Other skin changes: Secondary | ICD-10-CM

## 2017-10-04 LAB — CBC WITH DIFFERENTIAL/PLATELET
BASOS ABS: 0 10*3/uL (ref 0–0.1)
Basophils Relative: 1 %
EOS PCT: 3 %
Eosinophils Absolute: 0.2 10*3/uL (ref 0–0.7)
HEMATOCRIT: 39.4 % (ref 35.0–47.0)
Hemoglobin: 13.3 g/dL (ref 12.0–16.0)
LYMPHS ABS: 2.6 10*3/uL (ref 1.0–3.6)
LYMPHS PCT: 35 %
MCH: 27.9 pg (ref 26.0–34.0)
MCHC: 33.7 g/dL (ref 32.0–36.0)
MCV: 82.8 fL (ref 80.0–100.0)
Monocytes Absolute: 0.6 10*3/uL (ref 0.2–0.9)
Monocytes Relative: 8 %
NEUTROS ABS: 4 10*3/uL (ref 1.4–6.5)
Neutrophils Relative %: 53 %
PLATELETS: 159 10*3/uL (ref 150–440)
RBC: 4.75 MIL/uL (ref 3.80–5.20)
RDW: 12.7 % (ref 11.5–14.5)
WBC: 7.5 10*3/uL (ref 3.6–11.0)

## 2017-10-04 NOTE — ED Provider Notes (Signed)
[facility]    CSN: [id number] Arrival date & time: 10/04/17  1455     History   Chief Complaint Chief Complaint  Patient presents with  . Bleeding/Bruising    HPI Amanda Fisher is a 27 y.o. female.   27 yo female with a c/o bruises on her chest and legs without any history of trauma or injuries. 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 Denies any gum bleeding, hematuria, melena, hematochezia or any other bleeding. Denies taking any aspirin, NSAIDs, hormones.   The history is provided by the patient.    Past Medical History:  Diagnosis Date  . Acid reflux   . Asthma   . Hypoglycemia     Patient Active Problem List   Diagnosis Date Noted  . Supervision of normal first pregnancy, antepartum 04/21/2017    Past Surgical History:  Procedure Laterality Date  . TONSILLECTOMY AND ADENOIDECTOMY    . WISDOM TOOTH EXTRACTION  2013    OB History    Gravida  1   Para      Term      Preterm      AB      Living        SAB      TAB      Ectopic      Multiple      Live Births               Home Medications    Prior to Admission medications   Medication Sig Start Date End Date Taking? Authorizing Provider  albuterol (PROAIR HFA) 108 (90 Base) MCG/ACT inhaler Inhale 2 puffs into the lungs daily as needed.   Yes [provider]  desloratadine (CLARINEX) 5 MG tablet Take 1 tablet by mouth daily. 02/06/17  Yes [provider]  fluticasone (FLONASE) 50 MCG/ACT nasal spray Place 1 spray into both nostrils daily.   Yes [provider]  montelukast (SINGULAIR) 10 MG tablet Take 1 tablet by mouth daily. 02/06/17  Yes [provider]  Prenatal Vit-Fe Fumarate-FA (MULTIVITAMIN-PRENATAL) 27-0.8 MG TABS tablet Take 1 tablet by mouth daily at 12 noon.   Yes [provider]  metroNIDAZOLE (FLAGYL) 500 MG tablet Take 1 tablet (500 mg total) by mouth 2 (two) times daily.  04/30/17   Rockne MenghiniNorman, Anne-Caroline, MD  predniSONE (DELTASONE) 50 MG tablet 1 tablet daily x 5 days. 06/18/17   Tommie Samsook, Jayce G, DO    Family History Family History  Problem Relation Age of Onset  . Cancer Mother 20       thyroid  . Thyroid disease Mother   . Hypertension Mother   . Cancer Father [phone]       appendix - ca caused them to rupture  . Hypertension Father   . Cancer Paternal Grandmother [id number]       cervical    Social History Social History   Tobacco Use  . Smoking status: Never Smoker  . Smokeless tobacco: Never Used  Substance Use Topics  . Alcohol use: Yes    Comment: occasionally  . Drug use: No     Allergies   Other and Fluoxetine   Review of Systems Review of Systems   Physical Exam Triage Vital Signs ED Triage Vitals  Enc Vitals Group     BP 10/04/17 1548 105/67     Pulse Rate 10/04/17 1548 63     Resp 10/04/17 1548 18  Temp 10/04/17 1548 98.2 F (36.8 C)     Temp Source 10/04/17 1548 Oral     SpO2 10/04/17 1548 100 %     Weight 10/04/17 1546 145 lb (65.8 kg)     Height 10/04/17 1546 5\' 3"  (1.6 m)     Head Circumference --      Peak Flow --      Pain Score 10/04/17 1546 1     Pain Loc --      Pain Edu? --      Excl. in GC? --    No data found.  Updated Vital Signs BP 105/67 (BP Location: Left Arm)   Pulse 63   Temp 98.2 F (36.8 C) (Oral)   Resp 18   Ht 5\' 3"  (1.6 m)   Wt 145 lb (65.8 kg)   LMP 09/20/2017   SpO2 100%   Breastfeeding? No   BMI 25.69 kg/m   Visual Acuity Right Eye Distance:   Left Eye Distance:   Bilateral Distance:    Right Eye Near:   Left Eye Near:    Bilateral Near:     Physical Exam  Constitutional: She appears well-developed and well-nourished. No distress.  Skin: Bruising (on chest and legs, mild) noted. She is not diaphoretic.  Nursing note and vitals reviewed.    UC Treatments / Results  Labs (all labs ordered are listed, but only abnormal results are displayed) Labs Reviewed  CBC WITH  DIFFERENTIAL/PLATELET    EKG None  Radiology No results found.  Procedures Procedures (including critical care time)  Medications Ordered in UC Medications - No data to display  Initial Impression / Assessment and Plan / UC Course  I have reviewed the triage vital signs and the nursing notes.  Pertinent labs & imaging results that were available during my care of the patient were reviewed by me and considered in my medical decision making (see chart for details).      Final Clinical Impressions(s) / UC Diagnoses   Final diagnoses:  Easy bruising  (unknown etiology)   Discharge Instructions     Recommend follow up with primary care provider/hematologist for further evaluation    ED Prescriptions    None     1. Lab results (platelets normal) and diagnosis reviewed with patient 2.recommend follow up with PCP for further evaluation and/or referral to hematologist for further evaluation Controlled Substance Prescriptions [place] Controlled Substance Registry consulted? Not Applicable   Payton Mccallum, MD 10/04/17 1728

## 2017-10-04 NOTE — Discharge Instructions (Signed)
Recommend follow up with primary care provider/hematologist for further evaluation

## 2017-10-04 NOTE — ED Triage Notes (Signed)
Patient complains of multiple bruises (both inner thighs and chest) that she is concerned about. Patient denies pain or injury. Patient states that she ITP as a child. Patient reports that she would like to get her platelets checked today if possible.

## 2018-03-25 ENCOUNTER — Ambulatory Visit
Admission: EM | Admit: 2018-03-25 | Discharge: 2018-03-25 | Disposition: A | Discharge status: Home / Self Care | Payer: BLUE CROSS/BLUE SHIELD | Attending: Family Medicine | Admitting: Family Medicine

## 2018-03-25 ENCOUNTER — Encounter: Payer: Self-pay | Admitting: Emergency Medicine

## 2018-03-25 ENCOUNTER — Other Ambulatory Visit: Payer: Self-pay

## 2018-03-25 DIAGNOSIS — J069 Acute upper respiratory infection, unspecified: Secondary | ICD-10-CM

## 2018-03-25 DIAGNOSIS — B9789 Other viral agents as the cause of diseases classified elsewhere: Secondary | ICD-10-CM | POA: Diagnosis not present

## 2018-03-25 LAB — RAPID STREP SCREEN (MED CTR MEBANE ONLY): Streptococcus, Group A Screen (Direct): NEGATIVE

## 2018-03-25 NOTE — ED Triage Notes (Signed)
Patient is [redacted] weeks pregnant, not 2.

## 2018-03-25 NOTE — ED Provider Notes (Signed)
[facility]    CSN: [id number] Arrival date & time: 03/25/18  [id number]  History   Chief Complaint Chief Complaint  Patient presents with  . Sore Throat   HPI  27 year old female who is currently [redacted] weeks pregnant presents with respiratory symptoms.  Patient reports a 3 to 4-day history of sore throat.  She reports chills.  No documented fever.  Throat is severe.  Worsening as of yesterday.  Patient also reports that she has now lost her voice.  No medications or interventions tried.  No known exacerbating factors.  No reported sick contacts.  No other reported symptoms.  No other complaints.  PMH, Surgical Hx, Family Hx, Social History reviewed and updated as below.  Past Medical History:  Diagnosis Date  . Acid reflux   . Asthma   . Hypoglycemia     Patient Active Problem List   Diagnosis Date Noted  . Supervision of normal first pregnancy, antepartum 04/21/2017    Past Surgical History:  Procedure Laterality Date  . TONSILLECTOMY AND ADENOIDECTOMY    . WISDOM TOOTH EXTRACTION  2013    OB History    Gravida  2   Para      Term      Preterm      AB      Living        SAB      TAB      Ectopic      Multiple      Live Births               Home Medications    Prior to Admission medications   Medication Sig Start Date End Date Taking? Authorizing Provider  desloratadine (CLARINEX) 5 MG tablet Take 1 tablet by mouth daily. 02/06/17  Yes [provider]  fluticasone (FLONASE) 50 MCG/ACT nasal spray Place 1 spray into both nostrils daily.   Yes [provider]  montelukast (SINGULAIR) 10 MG tablet Take 1 tablet by mouth daily. 02/06/17  Yes [provider]  Prenatal Vit-Fe Fumarate-FA (MULTIVITAMIN-PRENATAL) 27-0.8 MG TABS tablet Take 1 tablet by mouth daily at 12 noon.   Yes [provider]  albuterol (PROAIR HFA) 108 (90 Base) MCG/ACT inhaler Inhale 2 puffs into the lungs daily as needed.    [provider]  metroNIDAZOLE (FLAGYL) 500 MG tablet Take 1 tablet (500 mg total) by mouth 2 (two) times daily. 04/30/17   Rockne MenghiniNorman, Anne-Caroline, MD  predniSONE (DELTASONE) 50 MG tablet 1 tablet daily x 5 days. 06/18/17   Tommie Samsook, Annalysse Shoemaker G, DO    Family History Family History  Problem Relation Age of Onset  . Cancer Mother 20       thyroid  . Thyroid disease Mother   . Hypertension Mother   . Cancer Father 7256       appendix - ca caused them to rupture  . Hypertension Father   . Cancer Paternal Grandmother 7060       cervical    Social History Social History   Tobacco Use  . Smoking status: Never Smoker  . Smokeless tobacco: Never Used  Substance Use Topics  . Alcohol use: Not Currently    Comment: occasionally  . Drug use: No     Allergies   Other and Fluoxetine   Review of Systems Review of Systems  Constitutional: Positive for chills.  HENT: Positive for sore throat and voice change.    Physical Exam Triage Vital Signs ED Triage Vitals  Enc Vitals Group     BP 03/25/18 1016 108/78     Pulse Rate 03/25/18 1016 73     Resp 03/25/18 1016 16     Temp 03/25/18 1016 97.9 F (36.6 C)     Temp Source 03/25/18 1016 Oral     SpO2 03/25/18 1016 100 %     Weight 03/25/18 1017 145 lb (65.8 kg)     Height 03/25/18 1017 5\' 3"  (1.6 m)     Head Circumference --      Peak Flow --      Pain Score 03/25/18 1017 3     Pain Loc --      Pain Edu? --      Excl. in GC? --    Updated Vital Signs BP 108/78 (BP Location: Left Arm)   Pulse 73   Temp 97.9 F (36.6 C) (Oral)   Resp 16   Ht 5\' 3"  (1.6 m)   Wt 65.8 kg   LMP 01/06/2018   SpO2 100%   BMI 25.69 kg/m   Visual Acuity Right Eye Distance:   Left Eye Distance:   Bilateral Distance:    Right Eye Near:   Left Eye Near:    Bilateral Near:     Physical Exam Vitals signs and nursing note reviewed.  Constitutional:      General: She is not in acute distress. HENT:     Head: Normocephalic and atraumatic.     Right  Ear: Tympanic membrane normal.     Left Ear: Tympanic membrane normal.     Mouth/Throat:     Pharynx: Oropharynx is clear. Uvula midline.  Eyes:     General:        Right eye: No discharge.        Left eye: No discharge.     Conjunctiva/sclera: Conjunctivae normal.  Cardiovascular:     Rate and Rhythm: Normal rate and regular rhythm.  Pulmonary:     Effort: Pulmonary effort is normal. No respiratory distress.     Breath sounds: No wheezing, rhonchi or rales.  Neurological:     Mental Status: She is alert.  Psychiatric:        Mood and Affect: Mood normal.        Behavior: Behavior normal.    UC Treatments / Results  Labs (all labs ordered are listed, but only abnormal results are displayed) Labs Reviewed  RAPID STREP SCREEN ([facility] ONLY)  CULTURE, GROUP A STREP [facility])    EKG None  Radiology No results found.  Procedures Procedures (including critical care time)  Medications Ordered in UC Medications - No data to display  Initial Impression / Assessment and Plan / UC Course  I have reviewed the triage vital signs and the nursing notes.  Pertinent labs & imaging results that were available during my care of the patient were reviewed by me and considered in my medical decision making (see chart for details).    27 year old female presents with viral URI.  Strep negative.  Exam unremarkable.  This appears to be viral in origin.  Rest, fluids.  Salt water gargles.  Tylenol as needed.  Final Clinical Impressions(s) / UC Diagnoses   Final diagnoses:  Viral URI     Discharge Instructions     Rest. Fluids.  Warm salt water gargles and Tylenol.  I hope you feel better soon.  Merry Christmas  Dr. Adriana Simas   ED Prescriptions    None  Controlled Substance Prescriptions [place] Controlled Substance Registry consulted? Not Applicable   Tommie SamsCook, Anielle Headrick G, DO 03/25/18 1159

## 2018-03-25 NOTE — ED Triage Notes (Signed)
Patient in today c/o sore throat and chills x 3-4 days, worsening yesterday. Patient is [redacted] weeks pregnant.

## 2018-03-25 NOTE — Discharge Instructions (Signed)
Rest. Fluids.  Warm salt water gargles and Tylenol.  I hope you feel better soon.  Merry Christmas  Dr. Adriana Simasook

## 2018-03-27 LAB — CULTURE, GROUP A STREP (THRC)

## 2018-08-06 ENCOUNTER — Inpatient Hospital Stay: Payer: BLUE CROSS/BLUE SHIELD

## 2018-08-06 ENCOUNTER — Other Ambulatory Visit: Payer: Self-pay

## 2018-08-06 ENCOUNTER — Encounter: Payer: Self-pay | Admitting: *Deleted

## 2018-08-06 ENCOUNTER — Inpatient Hospital Stay
Admission: EM | Admit: 2018-08-06 | Discharge: 2018-08-06 | Disposition: A | Discharge status: Home / Self Care | Payer: BLUE CROSS/BLUE SHIELD | Attending: Obstetrics and Gynecology | Admitting: Obstetrics and Gynecology

## 2018-08-06 DIAGNOSIS — O4693 Antepartum hemorrhage, unspecified, third trimester: Secondary | ICD-10-CM | POA: Insufficient documentation

## 2018-08-06 NOTE — OB Triage Note (Signed)
Patient to obs 2 with complaint of spotting that began after having sex around 1:30pm today.  She is having slight cramping that she rates 2/10.  She has concerns about lack of fetal movement.

## 2018-08-06 NOTE — Discharge Summary (Signed)
TRIAGE VISIT with NST   Kwan Berland is a 28 y.o. G2P0010. She is at [redacted]w[redacted]d gestation.  Indication: spotting after intercourse  S: Resting comfortably. no CTX, no VB. Active fetal movement now, though earlier decreased. Vaginal bleeding in 1st trimester, with normal ultrasound findings at that time.  Pink spotting after intercourse earlier today. No dysuria or vaginal sx indicative of infection.  O:  LMP 01/06/2018  No results found for this or any previous visit (from the past 48 hour(s)).   Gen: NAD, AAOx3      Abd: FNTTP    Ext: Non-tender, Nonedmeatous    FHT: 150/mod variability/non reactive but appropriate for gestational age/no decels TOCO: quiet SVE:  closed/thick and high   A/P:  28 y.o. G2P0010 [redacted]w[redacted]d with vaginal spotting   Labor: not present.   No concerning sx  Fetal Wellbeing: NST is Reassuring  D/c home stable, precautions reviewed, follow-up as scheduled.

## 2019-01-14 ENCOUNTER — Encounter (HOSPITAL_COMMUNITY): Payer: Self-pay

## 2019-04-03 ENCOUNTER — Other Ambulatory Visit: Payer: Self-pay

## 2019-04-03 ENCOUNTER — Encounter: Payer: Self-pay | Admitting: Certified Nurse Midwife

## 2019-04-03 ENCOUNTER — Ambulatory Visit (INDEPENDENT_AMBULATORY_CARE_PROVIDER_SITE_OTHER): Payer: Medicaid Other | Admitting: Certified Nurse Midwife

## 2019-04-03 VITALS — BP 100/70 | HR 53 | Ht 63.0 in | Wt 153.0 lb

## 2019-04-03 DIAGNOSIS — N952 Postmenopausal atrophic vaginitis: Secondary | ICD-10-CM

## 2019-04-03 DIAGNOSIS — N941 Unspecified dyspareunia: Secondary | ICD-10-CM | POA: Diagnosis not present

## 2019-04-03 MED ORDER — ESTRADIOL 0.1 MG/GM VA CREA: 1.0000 | TOPICAL_CREAM | VAGINAL | 0 refills | Status: DC

## 2019-04-03 MED ORDER — ESTRADIOL 0.1 MG/GM VA CREA: TOPICAL_CREAM | VAGINAL | 0 refills | Status: DC

## 2019-04-03 NOTE — Progress Notes (Addendum)
Obstetrics & Gynecology Office Visit   Chief Complaint:  Chief Complaint  Patient presents with  . Gynecologic Exam    A LOT of pain in one place during intimacy, a little bleeding after even c lubricant    History of Present Illness: 28 year old G2 P1011 who had a SVD on 10/29/2018 delivering a 7#15oz female infant over an intact perineum. Since resuming intercourse after her delivery she has had dyspareunia. She has pain on intromission, especially on the right side of the introitus. She has been breastfeeding exclusively, usually every 2-3 hours. She denies any increased vaginal discharge or vulvar itching Lubricants have not helped decrease the dyspareunia. She is amenorrheic with breast feeding   Past Medical History is remarkable for asthma and GERD and depression.  Review of Systems:  Review of Systems  Constitutional: Positive for malaise/fatigue. Negative for chills, fever and weight loss.  HENT: Positive for congestion. Negative for sinus pain and sore throat.   Eyes: Negative for blurred vision and pain.  Respiratory: Negative for hemoptysis, shortness of breath and wheezing.   Cardiovascular: Negative for chest pain, palpitations and leg swelling.  Gastrointestinal: Negative for abdominal pain, blood in stool, diarrhea, heartburn, nausea and vomiting.  Genitourinary: Negative for dysuria, frequency, hematuria and urgency.       Positive for painful intercourse  Musculoskeletal: Negative for back pain, joint pain and myalgias.  Skin: Negative for itching and rash.  Neurological: Positive for dizziness. Negative for tingling and headaches.  Endo/Heme/Allergies: Positive for environmental allergies (and sneezing). Negative for polydipsia. Does not bruise/bleed easily.       Negative for hirsutism   Psychiatric/Behavioral: Negative for depression. The patient is not nervous/anxious and does not have insomnia.      Past Medical History:  Past Medical History:  Diagnosis  Date  . Acid reflux   . Asthma   . Depression   . Hypoglycemia     Past Surgical History:  Past Surgical History:  Procedure Laterality Date  . TONSILLECTOMY AND ADENOIDECTOMY    . WISDOM TOOTH EXTRACTION  2013    Gynecologic History: Patient's last menstrual period was 01/05/2018 (approximate).  Obstetric History: G2P0010  Family History:  Family History  Problem Relation Age of Onset  . Cancer Mother 20       thyroid  . Thyroid disease Mother   . Hypertension Mother   . Cancer Father 28       appendix - ca caused them to rupture  . Hypertension Father   . Cancer Paternal Grandmother 69       cervical    Social History:  Social History   Socioeconomic History  . Marital status: Married    Spouse name: Not on file  . Number of children: 1  . Years of education: 41  . Highest education level: Not on file  Occupational History  . Occupation: food and minor maintenance    Employer: [employer]  Tobacco Use  . Smoking status: Never Smoker  . Smokeless tobacco: Never Used  Substance and Sexual Activity  . Alcohol use: Not Currently    Comment: occasionally  . Drug use: No  . Sexual activity: Yes    Birth control/protection: None  Other Topics Concern  . Not on file  Social History Narrative  . Not on file   Social Determinants of Health   Financial Resource Strain:   . Difficulty of Paying Living Expenses: Not on file  Food Insecurity:   . Worried About  Running Out of Food in the Last Year: Not on file  . Ran Out of Food in the Last Year: Not on file  Transportation Needs:   . Lack of Transportation (Medical): Not on file  . Lack of Transportation (Non-Medical): Not on file  Physical Activity:   . Days of Exercise per Week: Not on file  . Minutes of Exercise per Session: Not on file  Stress:   . Feeling of Stress : Not on file  Social Connections:   . Frequency of Communication with Friends and Family: Not on file  . Frequency of Social Gatherings with  Friends and Family: Not on file  . Attends Religious Services: Not on file  . Active Member of Clubs or Organizations: Not on file  . Attends Banker Meetings: Not on file  . Marital Status: Not on file  Intimate Partner Violence:   . Fear of Current or Ex-Partner: Not on file  . Emotionally Abused: Not on file  . Physically Abused: Not on file  . Sexually Abused: Not on file    Allergies:  Allergies  Allergen Reactions  . Other     SEASONAL  . Promethazine Other (See Comments)    Shaking, restless legs  . Tamiflu [Oseltamivir] Other (See Comments)    depression  . Fluoxetine Rash    Medications: Prior to Admission medications   Medication Sig Start Date End Date Taking? Authorizing Provider  Prenatal Vit-Fe Fumarate-FA (MULTIVITAMIN-PRENATAL) 27-0.8 MG TABS tablet Take 1 tablet by mouth daily at 12 noon.   Yes [provider]  sertraline (ZOLOFT) 50 MG tablet Take 25 mg by mouth daily. 11/09/18  Yes [provider]    Physical Exam Vitals: BP 100/70   Pulse (!) 53   Ht 5\' 3"  (1.6 m)   Wt 153 lb (69.4 kg)   LMP 01/05/2018 (Approximate)   Breastfeeding Yes   BMI 27.10 kg/m   Physical Exam  Constitutional: She is oriented to person, place, and time. She appears well-developed and well-nourished. No distress.  Genitourinary:    Genitourinary Comments: Vulva: no lesions or inflammation Vagina: tenderness on the right vaginal wall, 2-3 centimeters from the introitus. No lesions seen. Vaginal walls with flattened rugae and erythematous. Wet prep: basilar cells, no hyphae, Trich or clue cells   Neurological: She is alert and oriented to person, place, and time.  Skin: Skin is warm and dry.  Psychiatric: She has a normal mood and affect.     Assessment: 28 y.o. 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 RX for topical estrogen (0.5GM nightly x 2 weeks then decrease to 3x/week) Follow up if dyspareunia continues  30, CNM

## 2019-04-09 ENCOUNTER — Encounter: Payer: Self-pay | Admitting: Certified Nurse Midwife

## 2019-04-09 DIAGNOSIS — F321 Major depressive disorder, single episode, moderate: Secondary | ICD-10-CM | POA: Insufficient documentation

## 2019-04-09 DIAGNOSIS — J45909 Unspecified asthma, uncomplicated: Secondary | ICD-10-CM | POA: Insufficient documentation

## 2019-04-12 ENCOUNTER — Other Ambulatory Visit: Payer: Self-pay | Admitting: Certified Nurse Midwife

## 2019-05-23 ENCOUNTER — Telehealth: Payer: Self-pay

## 2019-05-23 ENCOUNTER — Other Ambulatory Visit: Payer: Self-pay | Admitting: Certified Nurse Midwife

## 2019-05-23 DIAGNOSIS — N941 Unspecified dyspareunia: Secondary | ICD-10-CM

## 2019-05-23 NOTE — Telephone Encounter (Signed)
Pt calling; estrogen cream didn't work; was told if it didn't work we would give her a referral for pelvic floor therapy.  Pt calling for referral.  [phone]

## 2019-05-23 NOTE — Telephone Encounter (Signed)
Referral completed

## 2019-06-04 ENCOUNTER — Encounter: Payer: Self-pay | Admitting: Emergency Medicine

## 2019-06-04 ENCOUNTER — Ambulatory Visit
Admission: EM | Admit: 2019-06-04 | Discharge: 2019-06-04 | Disposition: A | Discharge status: Home / Self Care | Payer: Medicaid Other | Attending: Urgent Care | Admitting: Urgent Care

## 2019-06-04 ENCOUNTER — Other Ambulatory Visit: Payer: Self-pay

## 2019-06-04 DIAGNOSIS — H6692 Otitis media, unspecified, left ear: Secondary | ICD-10-CM

## 2019-06-04 MED ORDER — NEOMYCIN-POLYMYXIN-HC 3.5-10000-1 OT SUSP: 4.0000 [drp] | Freq: Three times a day (TID) | OTIC | 0 refills | Status: DC

## 2019-06-04 MED ORDER — AMOXICILLIN-POT CLAVULANATE 875-125 MG PO TABS: 1.0000 | ORAL_TABLET | Freq: Two times a day (BID) | ORAL | 0 refills | Status: AC

## 2019-06-04 NOTE — Discharge Instructions (Addendum)
It was very nice seeing you today in clinic. Thank you for entrusting me with your care.   Keep ears clean and dry. Please utilize the medications that we discussed. Your prescriptions has been called in to your pharmacy. May use Tylenol and/or Ibuprofen as needed for pain/fever.   Make arrangements to follow up with your regular doctor in 1 week for re-evaluation if not improving.  If your symptoms/condition worsens, please seek follow up care either here or in the ER. Please remember, our [facility] providers are "right here with you" when you need [place].   Again, it was my pleasure to take care of you today. Thank you for choosing our clinic. I hope that you start to feel better quickly.   Quentin Mulling, MSN, APRN, FNP-C, CEN Advanced Practice Provider [facility]

## 2019-06-04 NOTE — ED Provider Notes (Signed)
[place], [place]   Name: Amanda Fisher DOB: [birth year] MRN: [id number] CSN: [id number] PCP: Patient, No Pcp Per  Arrival date and time:  06/04/19 0931  Chief Complaint:  Otalgia   NOTE: Prior to seeing the patient today, I have reviewed the triage nursing documentation and vital signs. Clinical staff has updated patient's PMH/PSHx, current medication list, and drug allergies/intolerances to ensure comprehensive history available to assist in medical decision making.   History:   HPI: Amanda Fisher is a 29 y.o. female who presents today with complaints of pain in her LEFT ear. Pain began with acute onset 3 days ago. She denies any associated fevers. Patient has not had any other recent upper respiratory symptoms; no cough, congestion, rhinorrhea, or sneezing. She has had a minor sore throat.  She denies forceful nose blowing. Patient has appreciated some clear otorrhea. She advises that her ability to hear from the LEFT ear has acutely changed with the onset of the pain; describes hearing as being muffled. Patient denies history of frequent recurrent ear infections, although she did have an ear infection about 7 months ago. She had tympanostomy tubes as a child. Patient advising that she has not been swimming in the recent past. Patient denies the use of cotton tip swabs to clean her ears. Patient does not have a history of seasonal allergies. Despite her symptoms, patient has not taken any over the counter interventions to help improve/relieve her reported symptoms at home.   Past Medical History:  Diagnosis Date  . Acid reflux   . Asthma   . Depression   . Hypoglycemia     Past Surgical History:  Procedure Laterality Date  . TONSILLECTOMY AND ADENOIDECTOMY    . WISDOM TOOTH EXTRACTION  2013    Family History  Problem Relation Age of Onset  . Cancer Mother 20       thyroid  . Thyroid disease Mother   . Hypertension Mother   . Cancer Father 78       appendix - ca caused them to  rupture  . Hypertension Father   . Cancer Paternal Grandmother 96       cervical    Social History   Tobacco Use  . Smoking status: Never Smoker  . Smokeless tobacco: Never Used  Substance Use Topics  . Alcohol use: Not Currently    Comment: occasionally  . Drug use: No    Patient Active Problem List   Diagnosis Date Noted  . Asthma 04/09/2019  . Depression 04/09/2019  . Vaginal bleeding in pregnancy, third trimester 08/06/2018  . Supervision of normal first pregnancy, antepartum 04/21/2017    Home Medications:    Current Meds  Medication Sig  . Prenatal Vit-Fe Fumarate-FA (MULTIVITAMIN-PRENATAL) 27-0.8 MG TABS tablet Take 1 tablet by mouth daily at 12 noon.    Allergies:   Other, Promethazine, Tamiflu [oseltamivir], and Fluoxetine  Review of Systems (ROS):  Review of systems NEGATIVE unless otherwise noted in narrative H&P section.   Vital Signs: Today's Vitals   06/04/19 0942 06/04/19 0943 06/04/19 0946  BP:   104/74  Pulse:   67  Resp:   14  Temp:   97.7 F (36.5 C)  TempSrc:   Oral  SpO2:   99%  Weight:  152 lb (68.9 kg)   Height:  5\' 3"  (1.6 m)   PainSc: 6       Physical Exam: Physical Exam  Constitutional: She is oriented to person, place, and time and well-developed, well-nourished, and  in no distress.  HENT:  Head: Normocephalic and atraumatic.  Right Ear: Tympanic membrane is scarred (2/2 past tympanostomy tubes).  Left Ear: There is drainage (clear) and tenderness. Tympanic membrane is scarred (2/2 past tympanostomy tubes), erythematous and bulging (mild). A middle ear effusion (suppurative) is present. Decreased hearing (muffled) is noted.  Nose: Rhinorrhea present.  Mouth/Throat: Uvula is midline and mucous membranes are normal. Posterior oropharyngeal erythema (+) mild clear PND present. No oropharyngeal exudate or posterior oropharyngeal edema.  Eyes: Pupils are equal, round, and reactive to light.  Cardiovascular: Normal rate.    Pulmonary/Chest: Effort normal. No respiratory distress.  Lymphadenopathy:       Head (left side): Submandibular adenopathy present.  Neurological: She is alert and oriented to person, place, and time. Gait normal.  Skin: Skin is warm and dry. No rash noted. She is not diaphoretic.  Psychiatric: Mood, memory, affect and judgment normal.  Nursing note and vitals reviewed.   Urgent Care Treatments / Results:   No orders of the defined types were placed in this encounter.   LABS: PLEASE NOTE: all labs that were ordered this encounter are listed, however only abnormal results are displayed. Labs Reviewed - No data to display  EKG: -None  RADIOLOGY: No results found.  PROCEDURES: Procedures  MEDICATIONS RECEIVED THIS VISIT: Medications - No data to display  PERTINENT CLINICAL COURSE NOTES/UPDATES:   Initial Impression / Assessment and Plan / Urgent Care Course:  Pertinent labs & imaging results that were available during my care of the patient were personally reviewed by me and considered in my medical decision making (see lab/imaging section of note for values and interpretations).  Amanda Fisher is a 29 y.o. female who presents to [facility] today with complaints of Otalgia  Patient is well appearing overall in clinic today. She does not appear to be in any acute distress. Presenting symptoms (see HPI) and exam as documented above. Exam reveals infection of both the internal and external ear on the LEFT. There is a suppurative effusion noted. (+) clear otorrhea. Will proceed with treatment for AOME and otitis externa on the LEFT with a 10 day course of amoxicillin-clavulanate and a 5 day course of Cortisporin gtts. Patient encouraged to keep ears clean and dry. May use Tylenol and/or Ibuprofen as needed for pain/fever.   Discussed follow up with primary care physician in 1 week for re-evaluation. I have reviewed the follow up and strict return precautions for any new or  worsening symptoms. Patient is aware of symptoms that would be deemed urgent/emergent, and would thus require further evaluation either here or in the emergency department. At the time of discharge, she verbalized understanding and consent with the discharge plan as it was reviewed with her. All questions were fielded by provider and/or clinic staff prior to patient discharge.    Final Clinical Impressions / Urgent Care Diagnoses:   Final diagnoses:  Acute infection of left ear    New Prescriptions:  [place] Controlled Substance Registry consulted? Not Applicable  Meds ordered this encounter  Medications  . amoxicillin-clavulanate (AUGMENTIN) 875-125 MG tablet    Sig: Take 1 tablet by mouth 2 (two) times daily for 10 days.    Dispense:  20 tablet    Refill:  0  . neomycin-polymyxin-hydrocortisone (CORTISPORIN) 3.5-10000-1 OTIC suspension    Sig: Place 4 drops into the left ear 3 (three) times daily. X 5 days    Dispense:  10 mL    Refill:  0  Recommended Follow up Care:  Patient encouraged to follow up with the following provider within the specified time frame, or sooner as dictated by the severity of her symptoms. As always, she was instructed that for any urgent/emergent care needs, she should seek care either here or in the emergency department for more immediate evaluation.  Follow-up Information    PCP In 1 week.   Why: General reassessment of symptoms if not improving        NOTE: This note was prepared using Scientist, clinical (histocompatibility and immunogenetics) along with smaller Lobbyist. Despite my best ability to proofread, there is the potential that transcriptional errors may still occur from this process, and are completely unintentional.    Verlee Monte, NP 06/04/19 1018

## 2019-06-04 NOTE — ED Triage Notes (Signed)
Patient c/o left ear pain that started 3 days ago.  Patient also reports sore throat , stuffy nose and bodyaches.  Patient denies fevers.

## 2019-06-12 ENCOUNTER — Telehealth (HOSPITAL_COMMUNITY): Payer: Self-pay

## 2019-06-12 NOTE — Telephone Encounter (Signed)
Pt called and spoke with this RN about concern for allergic reaction to Augmentin. Pt reports throat tightness 30 min to an hour after taking Augmentin. Reports completing ear drops and being on day 7 of Augmentin.  Reports taking benadryl and feeling better. States that her ear is feeling better. Spoke with Dr. Judd Gaudier and he stated that the patient does need additional antibiotic treatment. Pt educated to stop the antibiotics and seek emergency treatment if she is not feeling better from the benadryl or she experiences any tightness/swelling in her throat, lips, tongue or shortness of breath. Pt denies these symptoms at this time. Verbalized understanding.

## 2019-08-06 ENCOUNTER — Other Ambulatory Visit: Payer: Self-pay

## 2019-08-06 ENCOUNTER — Encounter: Payer: Self-pay | Admitting: Emergency Medicine

## 2019-08-06 ENCOUNTER — Emergency Department
Admission: EM | Admit: 2019-08-06 | Discharge: 2019-08-07 | Disposition: A | Discharge status: Other Acute Inpatient Hospital | Payer: Medicaid Other | Attending: Emergency Medicine | Admitting: Emergency Medicine

## 2019-08-06 DIAGNOSIS — R45851 Suicidal ideations: Secondary | ICD-10-CM | POA: Insufficient documentation

## 2019-08-06 DIAGNOSIS — J45909 Unspecified asthma, uncomplicated: Secondary | ICD-10-CM | POA: Insufficient documentation

## 2019-08-06 DIAGNOSIS — T39312A Poisoning by propionic acid derivatives, intentional self-harm, initial encounter: Secondary | ICD-10-CM | POA: Insufficient documentation

## 2019-08-06 DIAGNOSIS — T50902A Poisoning by unspecified drugs, medicaments and biological substances, intentional self-harm, initial encounter: Secondary | ICD-10-CM

## 2019-08-06 DIAGNOSIS — T1491XA Suicide attempt, initial encounter: Secondary | ICD-10-CM

## 2019-08-06 DIAGNOSIS — T450X2A Poisoning by antiallergic and antiemetic drugs, intentional self-harm, initial encounter: Secondary | ICD-10-CM | POA: Insufficient documentation

## 2019-08-06 DIAGNOSIS — F329 Major depressive disorder, single episode, unspecified: Secondary | ICD-10-CM | POA: Diagnosis not present

## 2019-08-06 DIAGNOSIS — Z20822 Contact with and (suspected) exposure to covid-19: Secondary | ICD-10-CM | POA: Diagnosis not present

## 2019-08-06 LAB — ETHANOL: Alcohol, Ethyl (B): <10 mg/dL (ref <10)

## 2019-08-06 LAB — CBC
HCT: 42.8 % (ref 36.0–46.0)
Hemoglobin: 13.9 g/dL (ref 12.0–15.0)
MCH: 27.1 pg (ref 26.0–34.0)
MCHC: 32.5 g/dL (ref 30.0–36.0)
MCV: 83.4 fL (ref 80.0–100.0)
Platelets: 165 10*3/uL (ref 150–400)
RBC: 5.13 MIL/uL — ABNORMAL HIGH (ref 3.87–5.11)
RDW: 12.3 % (ref 11.5–15.5)
WBC: 9.4 10*3/uL (ref 4.0–10.5)
nRBC: 0 % (ref 0.0–0.2)

## 2019-08-06 LAB — COMPREHENSIVE METABOLIC PANEL
ALT: 16 U/L (ref 0–44)
AST: 24 U/L (ref 15–41)
Albumin: 4.5 g/dL (ref 3.5–5.0)
Alkaline Phosphatase: 61 U/L (ref 38–126)
Anion gap: 11 (ref 5–15)
BUN: 13 mg/dL (ref 6–20)
CO2: 24 mmol/L (ref 22–32)
Calcium: 9.7 mg/dL (ref 8.9–10.3)
Chloride: 105 mmol/L (ref 98–111)
Creatinine, Ser: 0.69 mg/dL (ref 0.44–1.00)
GFR calc Af Amer: >60 mL/min (ref >60)
GFR calc non Af Amer: >60 mL/min (ref >60)
Glucose, Bld: 89 mg/dL (ref 70–99)
Potassium: 3.9 mmol/L (ref 3.5–5.1)
Sodium: 140 mmol/L (ref 135–145)
Total Bilirubin: 0.8 mg/dL (ref 0.3–1.2)
Total Protein: 8.1 g/dL (ref 6.5–8.1)

## 2019-08-06 LAB — ACETAMINOPHEN LEVEL
Acetaminophen (Tylenol), Serum: <10 ug/mL — ABNORMAL LOW (ref 10–30)
Acetaminophen (Tylenol), Serum: <10 ug/mL — ABNORMAL LOW (ref 10–30)

## 2019-08-06 LAB — URINE DRUG SCREEN, QUALITATIVE (ARMC ONLY)
Amphetamines, Ur Screen: NOT DETECTED
Barbiturates, Ur Screen: NOT DETECTED
Benzodiazepine, Ur Scrn: NOT DETECTED
Cannabinoid 50 Ng, Ur ~~LOC~~: NOT DETECTED
Cocaine Metabolite,Ur ~~LOC~~: NOT DETECTED
MDMA (Ecstasy)Ur Screen: NOT DETECTED
Methadone Scn, Ur: NOT DETECTED
Opiate, Ur Screen: NOT DETECTED
Phencyclidine (PCP) Ur S: NOT DETECTED
Tricyclic, Ur Screen: POSITIVE — AB

## 2019-08-06 LAB — SALICYLATE LEVEL
Salicylate Lvl: <7 mg/dL — ABNORMAL LOW (ref 7.0–30.0)
Salicylate Lvl: <7 mg/dL — ABNORMAL LOW (ref 7.0–30.0)

## 2019-08-06 LAB — PREGNANCY, URINE: Preg Test, Ur: NEGATIVE

## 2019-08-06 MED ORDER — SODIUM CHLORIDE 0.9 % IV BOLUS: 1000.0000 mL | Freq: Once | INTRAVENOUS | Status: DC

## 2019-08-06 NOTE — ED Notes (Signed)
Scott, EDT attempted blood draw x 1 without success. RN advised pt may need IV, will speak with MD before sticking patient again.

## 2019-08-06 NOTE — ED Notes (Signed)
This RN spoke with poison control and they recommend monitor for 6 hours for tachycardia, watch for pt being drowsy, large pupils and dry mouth. Obtain 4 hours post ingestion tylenol level. If pt develops tachycardia monitor until resolved.

## 2019-08-06 NOTE — ED Triage Notes (Signed)
Pt to ED via POV, pt states about 1 hour PTA she took about 8-10 benadryl. Pt states that she did this in an attempt to harm herself. Pt has hx/o PPD. Pt was on Zoloft and then stopped taking it. Pt has been back on zoloft for about 4 days. Pt is calm and cooperative in triage.

## 2019-08-06 NOTE — ED Notes (Signed)
Pt dressed out in hospital provided attire by this RN. Pt's husband Sherill Mangen given pts belongings to take with him.

## 2019-08-06 NOTE — ED Triage Notes (Signed)
First nurse note- pt is suicidal, took 8-10 benadryl at 1700.  Family with pt until triage.

## 2019-08-06 NOTE — ED Provider Notes (Signed)
[facility] Emergency Department Provider Note  ____________________________________________   First MD Initiated Contact with Patient 08/06/19 1841     (approximate)  I have reviewed the triage vital signs and the nursing notes.   HISTORY  Chief Complaint Ingestion and Suicidal    HPI Amanda Fisher is a 29 y.o. female  Here with suicidal ideation. Pt reports a long h/o depression since age 63 but states she has never been hospitalized. She reports that over the pat few weeks, she has had worsening depression causing her to think about harming herself. Earlier this week she took multiple ibuprofen in an attempt to harm herself. Tonight, she felt dysphoric and like she needed help so she took 6-10 benadryl. This was in an attempt to harm herself. Denies any coingestants. She is on Zoloft and just started taking this again. She reports her depression has been worse since having a child. No HI or AVH.       Past Medical History:  Diagnosis Date  . Acid reflux   . Asthma   . Depression   . Hypoglycemia     Patient Active Problem List   Diagnosis Date Noted  . Suicide attempt by drug ingestion (HCC) 08/07/2019  . Asthma 04/09/2019  . Depression 04/09/2019  . Vaginal bleeding in pregnancy, third trimester 08/06/2018  . Supervision of normal first pregnancy, antepartum 04/21/2017    Past Surgical History:  Procedure Laterality Date  . TONSILLECTOMY AND ADENOIDECTOMY    . WISDOM TOOTH EXTRACTION  2013    Prior to Admission medications   Medication Sig Start Date End Date Taking? Authorizing Provider  Prenatal Vit-Fe Fumarate-FA (MULTIVITAMIN-PRENATAL) 27-0.8 MG TABS tablet Take 1 tablet by mouth daily at 12 noon.   Yes [provider]  sertraline (ZOLOFT) 50 MG tablet Take 25 mg by mouth daily. 08/01/19 07/31/20 Yes [provider]  neomycin-polymyxin-hydrocortisone (CORTISPORIN) 3.5-10000-1 OTIC suspension Place 4 drops into the  left ear 3 (three) times daily. X 5 days 06/04/19   Verlee Monte, NP    Allergies Augmentin [amoxicillin-pot clavulanate], Other, Promethazine, Tamiflu [oseltamivir], and Fluoxetine  Family History  Problem Relation Age of Onset  . Cancer Mother 20       thyroid  . Thyroid disease Mother   . Hypertension Mother   . Cancer Father 103       appendix - ca caused them to rupture  . Hypertension Father   . Cancer Paternal Grandmother 50       cervical    Social History Social History   Tobacco Use  . Smoking status: Never Smoker  . Smokeless tobacco: Never Used  Substance Use Topics  . Alcohol use: Not Currently    Comment: occasionally  . Drug use: No    Review of Systems  Review of Systems  Constitutional: Negative for chills and fever.  HENT: Negative for sore throat.   Respiratory: Negative for shortness of breath.   Cardiovascular: Negative for chest pain.  Gastrointestinal: Negative for abdominal pain.  Genitourinary: Negative for flank pain.  Musculoskeletal: Negative for neck pain.  Skin: Negative for rash and wound.  Allergic/Immunologic: Negative for immunocompromised state.  Neurological: Negative for weakness and numbness.  Hematological: Does not bruise/bleed easily.  Psychiatric/Behavioral: Positive for dysphoric mood and suicidal ideas.  All other systems reviewed and are negative.    ____________________________________________  PHYSICAL EXAM:      VITAL SIGNS: ED Triage Vitals  Enc Vitals Group     BP 08/06/19  1816 (!) 120/91     Pulse --      Resp 08/06/19 1816 16     Temp 08/06/19 1816 98.8 F (37.1 C)     Temp Source 08/06/19 1816 Oral     SpO2 08/06/19 1816 100 %     Weight 08/06/19 1817 156 lb (70.8 kg)     Height 08/06/19 1817 5\' 3"  (1.6 m)     Head Circumference --      Peak Flow --      Pain Score 08/06/19 1817 0     Pain Loc --      Pain Edu? --      Excl. in [place]? --      Physical Exam Vitals and nursing note reviewed.    Constitutional:      General: She is not in acute distress.    Appearance: She is well-developed.  HENT:     Head: Normocephalic and atraumatic.  Eyes:     Conjunctiva/sclera: Conjunctivae normal.  Cardiovascular:     Rate and Rhythm: Normal rate and regular rhythm.     Heart sounds: Normal heart sounds.  Pulmonary:     Effort: Pulmonary effort is normal. No respiratory distress.     Breath sounds: No wheezing.  Abdominal:     General: There is no distension.  Musculoskeletal:     Cervical back: Neck supple.  Skin:    General: Skin is warm.     Capillary Refill: Capillary refill takes less than 2 seconds.     Findings: No rash.  Neurological:     Mental Status: She is alert and oriented to person, place, and time.     Motor: No abnormal muscle tone.  Psychiatric:        Mood and Affect: Mood is depressed.        Thought Content: Thought content includes suicidal ideation. Thought content includes suicidal plan.       ____________________________________________   LABS (all labs ordered are listed, but only abnormal results are displayed)  Labs Reviewed  CBC - Abnormal; Notable for the following components:      Result Value   RBC 5.13 (*)    All other components within normal limits  URINE DRUG SCREEN, QUALITATIVE ([facility] ONLY) - Abnormal; Notable for the following components:   Tricyclic, Ur Screen POSITIVE (*)    All other components within normal limits  ACETAMINOPHEN LEVEL - Abnormal; Notable for the following components:   Acetaminophen (Tylenol), Serum <10 (*)    All other components within normal limits  SALICYLATE LEVEL - Abnormal; Notable for the following components:   Salicylate Lvl <5.4 (*)    All other components within normal limits  ACETAMINOPHEN LEVEL - Abnormal; Notable for the following components:   Acetaminophen (Tylenol), Serum <10 (*)    All other components within normal limits  SALICYLATE LEVEL - Abnormal; Notable for the following  components:   Salicylate Lvl <2.7 (*)    All other components within normal limits  RESPIRATORY PANEL BY RT PCR (FLU A&B, COVID)  COMPREHENSIVE METABOLIC PANEL  ETHANOL  PREGNANCY, URINE  POC URINE PREG, ED    ____________________________________________  EKG: Normal sinus rhythm, VR 67. QRS 96, QTc 418. No acute ST elevations or depressions. No ischemia or infarct. ________________________________________  RADIOLOGY All imaging, including plain films, CT scans, and ultrasounds, independently reviewed by me, and interpretations confirmed via formal radiology reads.  ED MD interpretation:   None  Official radiology report(s): No results found.  ____________________________________________  PROCEDURES   Procedure(s) performed (including Critical Care):  .1-3 Lead EKG Interpretation Performed by: Shaune Pollack, MD Authorized by: Shaune Pollack, MD     Interpretation: normal     ECG rate:  70-90   ECG rate assessment: normal     Rhythm: sinus rhythm     Ectopy: none     Conduction: normal   Comments:     Indication: Benadryl overdose    ____________________________________________  INITIAL IMPRESSION / MDM / ASSESSMENT AND PLAN / ED COURSE  As part of my medical decision making, I reviewed the following data within the electronic MEDICAL RECORD NUMBER Nursing notes reviewed and incorporated, Old chart reviewed, Notes from prior ED visits, and [place] Controlled Substance Database       *Amanda Fisher was evaluated in Emergency Department on 08/07/2019 for the symptoms described in the history of present illness. She was evaluated in the context of the global COVID-19 pandemic, which necessitated consideration that the patient might be at risk for infection with the SARS-CoV-2 virus that causes COVID-19. Institutional protocols and algorithms that pertain to the evaluation of patients at risk for COVID-19 are in a state of rapid change based on information released by  regulatory bodies including the CDC and federal and state organizations. These policies and algorithms were followed during the patient's care in the ED.  Some ED evaluations and interventions may be delayed as a result of limited staffing during the pandemic.*     Medical Decision Making:  29 yo F here with suicide attempt via ingestion of diphenhydramine. She is HDS, well appearing on arrival without signs of anticholinergic toxidrome. EKG is without QRS or qt prolongation. No arrhythmia on telemetry. She was monitored >6 hr post ingestion with no arrhythmia, tachycardia, or signs of significant toxidrome. Will clear for psych admission. She is voluntary at this time.   The patient has been placed in psychiatric observation due to the need to provide a safe environment for the patient while obtaining psychiatric consultation and evaluation, as well as ongoing medical and medication management to treat the patient's condition.  The patient has not been placed under full IVC at this time. ____________________________________________  FINAL CLINICAL IMPRESSION(S) / ED DIAGNOSES  Final diagnoses:  Suicidal ideation  Suicide attempt (HCC)     MEDICATIONS GIVEN DURING THIS VISIT:  Medications - No data to display   ED Discharge Orders    None       Note:  This document was prepared using Dragon voice recognition software and may include unintentional dictation errors.   Shaune Pollack, MD 08/07/19 [phone]

## 2019-08-07 ENCOUNTER — Other Ambulatory Visit: Payer: Self-pay

## 2019-08-07 ENCOUNTER — Inpatient Hospital Stay
Admission: AD | Admit: 2019-08-07 | Discharge: 2019-08-09 | DRG: 885 | Disposition: A | Discharge status: Home / Self Care | Payer: Medicaid Other | Source: Intra-hospital | Attending: Psychiatry | Admitting: Psychiatry

## 2019-08-07 ENCOUNTER — Encounter: Payer: Self-pay | Admitting: Psychiatric/Mental Health

## 2019-08-07 DIAGNOSIS — Z915 Personal history of self-harm: Secondary | ICD-10-CM

## 2019-08-07 DIAGNOSIS — F329 Major depressive disorder, single episode, unspecified: Secondary | ICD-10-CM | POA: Diagnosis not present

## 2019-08-07 DIAGNOSIS — Z808 Family history of malignant neoplasm of other organs or systems: Secondary | ICD-10-CM

## 2019-08-07 DIAGNOSIS — Z8049 Family history of malignant neoplasm of other genital organs: Secondary | ICD-10-CM | POA: Diagnosis not present

## 2019-08-07 DIAGNOSIS — G4700 Insomnia, unspecified: Secondary | ICD-10-CM | POA: Diagnosis present

## 2019-08-07 DIAGNOSIS — F321 Major depressive disorder, single episode, moderate: Secondary | ICD-10-CM | POA: Diagnosis present

## 2019-08-07 DIAGNOSIS — T50902A Poisoning by unspecified drugs, medicaments and biological substances, intentional self-harm, initial encounter: Secondary | ICD-10-CM | POA: Diagnosis not present

## 2019-08-07 DIAGNOSIS — F419 Anxiety disorder, unspecified: Secondary | ICD-10-CM | POA: Diagnosis present

## 2019-08-07 DIAGNOSIS — R45851 Suicidal ideations: Secondary | ICD-10-CM | POA: Diagnosis present

## 2019-08-07 DIAGNOSIS — Z8249 Family history of ischemic heart disease and other diseases of the circulatory system: Secondary | ICD-10-CM

## 2019-08-07 DIAGNOSIS — F332 Major depressive disorder, recurrent severe without psychotic features: Principal | ICD-10-CM | POA: Diagnosis present

## 2019-08-07 DIAGNOSIS — T450X1A Poisoning by antiallergic and antiemetic drugs, accidental (unintentional), initial encounter: Secondary | ICD-10-CM

## 2019-08-07 DIAGNOSIS — Z8349 Family history of other endocrine, nutritional and metabolic diseases: Secondary | ICD-10-CM | POA: Diagnosis not present

## 2019-08-07 DIAGNOSIS — Z800 Family history of malignant neoplasm of digestive organs: Secondary | ICD-10-CM

## 2019-08-07 DIAGNOSIS — F429 Obsessive-compulsive disorder, unspecified: Secondary | ICD-10-CM | POA: Diagnosis present

## 2019-08-07 DIAGNOSIS — Z9089 Acquired absence of other organs: Secondary | ICD-10-CM | POA: Diagnosis not present

## 2019-08-07 LAB — COMPREHENSIVE METABOLIC PANEL
ALT: 15 U/L (ref 0–44)
AST: 18 U/L (ref 15–41)
Albumin: 4.4 g/dL (ref 3.5–5.0)
Alkaline Phosphatase: 56 U/L (ref 38–126)
Anion gap: 9 (ref 5–15)
BUN: 10 mg/dL (ref 6–20)
CO2: 27 mmol/L (ref 22–32)
Calcium: 9.3 mg/dL (ref 8.9–10.3)
Chloride: 103 mmol/L (ref 98–111)
Creatinine, Ser: 0.72 mg/dL (ref 0.44–1.00)
GFR calc Af Amer: >60 mL/min (ref >60)
GFR calc non Af Amer: >60 mL/min (ref >60)
Glucose, Bld: 86 mg/dL (ref 70–99)
Potassium: 4.4 mmol/L (ref 3.5–5.1)
Sodium: 139 mmol/L (ref 135–145)
Total Bilirubin: 0.8 mg/dL (ref 0.3–1.2)
Total Protein: 8 g/dL (ref 6.5–8.1)

## 2019-08-07 LAB — CBC WITH DIFFERENTIAL/PLATELET
Abs Immature Granulocytes: 0.02 10*3/uL (ref 0.00–0.07)
Basophils Absolute: 0 10*3/uL (ref 0.0–0.1)
Basophils Relative: 1 %
Eosinophils Absolute: 0.4 10*3/uL (ref 0.0–0.5)
Eosinophils Relative: 4 %
HCT: 43.1 % (ref 36.0–46.0)
Hemoglobin: 14.3 g/dL (ref 12.0–15.0)
Immature Granulocytes: 0 %
Lymphocytes Relative: 22 %
Lymphs Abs: 1.8 10*3/uL (ref 0.7–4.0)
MCH: 27.3 pg (ref 26.0–34.0)
MCHC: 33.2 g/dL (ref 30.0–36.0)
MCV: 82.3 fL (ref 80.0–100.0)
Monocytes Absolute: 0.7 10*3/uL (ref 0.1–1.0)
Monocytes Relative: 8 %
Neutro Abs: 5.5 10*3/uL (ref 1.7–7.7)
Neutrophils Relative %: 65 %
Platelets: 168 10*3/uL (ref 150–400)
RBC: 5.24 MIL/uL — ABNORMAL HIGH (ref 3.87–5.11)
RDW: 12.6 % (ref 11.5–15.5)
WBC: 8.4 10*3/uL (ref 4.0–10.5)
nRBC: 0 % (ref 0.0–0.2)

## 2019-08-07 LAB — TSH: TSH: 1.593 u[IU]/mL (ref 0.350–4.500)

## 2019-08-07 LAB — RESPIRATORY PANEL BY RT PCR (FLU A&B, COVID)
Influenza A by PCR: NEGATIVE
Influenza B by PCR: NEGATIVE
SARS Coronavirus 2 by RT PCR: NEGATIVE

## 2019-08-07 MED ORDER — SERTRALINE HCL 25 MG PO TABS
50.0000 mg | ORAL_TABLET | Freq: Every day | ORAL | Status: DC
  Administered 2019-08-07 – 2019-08-09 (×3): 50 mg via ORAL
  Filled 2019-08-07 (×3): qty 2

## 2019-08-07 MED ORDER — ALUM & MAG HYDROXIDE-SIMETH 200-200-20 MG/5ML PO SUSP: 30.0000 mL | ORAL | Status: DC | PRN

## 2019-08-07 MED ORDER — TRAZODONE HCL 50 MG PO TABS: 50.0000 mg | ORAL_TABLET | Freq: Every evening | ORAL | Status: DC | PRN

## 2019-08-07 MED ORDER — TRAZODONE HCL 50 MG PO TABS
50.0000 mg | ORAL_TABLET | Freq: Every day | ORAL | Status: DC
  Administered 2019-08-07 – 2019-08-08 (×2): 50 mg via ORAL
  Filled 2019-08-07 (×2): qty 1

## 2019-08-07 MED ORDER — MAGNESIUM HYDROXIDE 400 MG/5ML PO SUSP: 30.0000 mL | Freq: Every day | ORAL | Status: DC | PRN

## 2019-08-07 MED ORDER — ACETAMINOPHEN 325 MG PO TABS: 650.0000 mg | ORAL_TABLET | Freq: Four times a day (QID) | ORAL | Status: DC | PRN

## 2019-08-07 NOTE — H&P (Signed)
Psychiatric Admission Assessment Adult  Patient Identification: Amanda Fisher MRN:  [id number] Date of Evaluation:  08/07/2019 Chief Complaint:  MDD (major depressive disorder) [F32.9] Principal Diagnosis: Severe recurrent major depression without psychotic features (HCC) Diagnosis:  Principal Problem:   Severe recurrent major depression without psychotic features (HCC) Active Problems:   OCD (obsessive compulsive disorder)   Diphenhydramine overdose  History of Present Illness: Patient seen and chart reviewed.  29 year old woman presented to the emergency room after taking an intentional overdose of Benadryl.  She tells me that last night she could not sleep.  She was having severe anxiety symptoms.  She took 2 tablets of Benadryl initially in an attempt to sleep but soon thereafter impulsively took 8 to 10 tablets more.  She tells me she was not actually wanting to die but somehow felt that doing this would be a way to get treatment for her symptoms.  She told her husband about what she had done immediately.  Patient does admit that she has had suicidal thoughts recently.  She had an abrupt onset of anxiety about a week ago.  It happened one night in church.  She suddenly began to have severe anxiety with racing intrusive thoughts and obsessive worries.  Since then she has been consumed by anxiety starting in the morning of every day and throughout the day.  She has been unable to function normally.  Her sleep is impaired.  She is filled with depressed thoughts and self recrimination.  She has had suicidal thoughts however she denies that she was intending to kill her self.  Denies hallucinations or psychotic symptoms.  She denies being aware of any specific trigger for this although her obsessive ruminations are religious in nature and it sounds like her anxiety symptoms tend to abruptly worsen when she is at church services.  Denies any alcohol or drug abuse.  She had been off of her Zoloft for a few  months but was restarted at a low dose of 25 mg just about of 5 days ago.  Not on any other medication currently Associated Signs/Symptoms: Depression Symptoms:  depressed mood, anhedonia, insomnia, fatigue, feelings of worthlessness/guilt, difficulty concentrating, hopelessness, suicidal thoughts without plan, (Hypo) Manic Symptoms:  Distractibility, Impulsivity, Anxiety Symptoms:  Excessive Worry, Panic Symptoms, Obsessive Compulsive Symptoms:   Religious rumination, Psychotic Symptoms:  None reported PTSD Symptoms: Negative Total Time spent with patient: 1 hour  Past Psychiatric History: No previous hospitalizations.  Had been treated with Zoloft for post partum depression with some improvement.  No previous suicide attempts.  Has seen therapists in the past but just recently restarted with 1.  No history of violence.  Denies any history of substance abuse  Is the patient at risk to self? Yes.    Has the patient been a risk to self in the past 6 months? Yes.    Has the patient been a risk to self within the distant past? No.  Is the patient a risk to others? No.  Has the patient been a risk to others in the past 6 months? No.  Has the patient been a risk to others within the distant past? No.   Prior Inpatient Therapy:   Prior Outpatient Therapy:    Alcohol Screening: 1. How often do you have a drink containing alcohol?: Monthly or less 2. How many drinks containing alcohol do you have on a typical day when you are drinking?: 1 or 2 3. How often do you have six or more drinks on one  occasion?: Never AUDIT-C Score: 1 4. How often during the last year have you found that you were not able to stop drinking once you had started?: Never 5. How often during the last year have you failed to do what was normally expected from you becasue of drinking?: Never 6. How often during the last year have you needed a first drink in the morning to get yourself going after a heavy drinking  session?: Never 7. How often during the last year have you had a feeling of guilt of remorse after drinking?: Never 8. How often during the last year have you been unable to remember what happened the night before because you had been drinking?: Never 9. Have you or someone else been injured as a result of your drinking?: No 10. Has a relative or friend or a doctor or another health worker been concerned about your drinking or suggested you cut down?: No Alcohol Use Disorder Identification Test Final Score (AUDIT): 1 Alcohol Brief Interventions/Follow-up: AUDIT Score <7 follow-up not indicated Substance Abuse History in the last 12 months:  No. Consequences of Substance Abuse: Negative Previous Psychotropic Medications: Yes  Psychological Evaluations: Yes  Past Medical History:  Past Medical History:  Diagnosis Date  . Acid reflux   . Asthma   . Depression   . Hypoglycemia     Past Surgical History:  Procedure Laterality Date  . TONSILLECTOMY AND ADENOIDECTOMY    . WISDOM TOOTH EXTRACTION  2013   Family History:  Family History  Problem Relation Age of Onset  . Cancer Mother 20       thyroid  . Thyroid disease Mother   . Hypertension Mother   . Cancer Father 30       appendix - ca caused them to rupture  . Hypertension Father   . Cancer Paternal Grandmother 42       cervical   Family Psychiatric  History: Sounds like mother has had depression but no history of suicide in the family and no known family history of OCD Tobacco Screening: Have you used any form of tobacco in the last 30 days? (Cigarettes, Smokeless Tobacco, Cigars, and/or Pipes): No Social History:  Social History   Substance and Sexual Activity  Alcohol Use Not Currently   Comment: occasionally     Social History   Substance and Sexual Activity  Drug Use No    Additional Social History:                           Allergies:   Allergies  Allergen Reactions  . Augmentin [Amoxicillin-Pot  Clavulanate]   . Other     SEASONAL  . Promethazine Other (See Comments)    Shaking, restless legs  . Tamiflu [Oseltamivir] Other (See Comments)    depression  . Fluoxetine Rash   Lab Results:  Results for orders placed or performed during the hospital encounter of 08/06/19 (from the past 48 hour(s))  Comprehensive metabolic panel     Status: None   Collection Time: 08/06/19  6:43 PM  Result Value Ref Range   Sodium 140 135 - 145 mmol/L   Potassium 3.9 3.5 - 5.1 mmol/L    Comment: HEMOLYSIS AT THIS LEVEL MAY AFFECT RESULT   Chloride 105 98 - 111 mmol/L   CO2 24 22 - 32 mmol/L   Glucose, Bld 89 70 - 99 mg/dL    Comment: Glucose reference range applies only to samples taken after fasting  for at least 8 hours.   BUN 13 6 - 20 mg/dL   Creatinine, Ser 1.610.69 0.44 - 1.00 mg/dL   Calcium 9.7 8.9 - 09.610.3 mg/dL   Total Protein 8.1 6.5 - 8.1 g/dL   Albumin 4.5 3.5 - 5.0 g/dL   AST 24 15 - 41 U/L   ALT 16 0 - 44 U/L   Alkaline Phosphatase 61 38 - 126 U/L   Total Bilirubin 0.8 0.3 - 1.2 mg/dL   GFR calc non Af Amer >60 >60 mL/min   GFR calc Af Amer >60 >60 mL/min   Anion gap 11 5 - 15    Comment: Performed at [facility], [address] [place], [place] [address]  cbc     Status: Abnormal   Collection Time: 08/06/19  6:43 PM  Result Value Ref Range   WBC 9.4 4.0 - 10.5 K/uL   RBC 5.13 (H) 3.87 - 5.11 MIL/uL   Hemoglobin 13.9 12.0 - 15.0 g/dL   HCT 98.142.8 19.136.0 - 47.846.0 %   MCV 83.4 80.0 - 100.0 fL   MCH 27.1 26.0 - 34.0 pg   MCHC 32.5 30.0 - 36.0 g/dL   RDW 29.512.3 62.111.5 - 30.815.5 %   Platelets 165 150 - 400 K/uL   nRBC 0.0 0.0 - 0.2 %    Comment: Performed at [facility], [address] [place], [place] [address]  Acetaminophen level     Status: Abnormal   Collection Time: 08/06/19  8:10 PM  Result Value Ref Range   Acetaminophen (Tylenol), Serum <10 (L) 10 - 30 ug/mL    Comment: (NOTE) Therapeutic concentrations vary significantly. A range of 10-30 ug/mL  may be  an effective concentration for many patients. However, some  are best treated at concentrations outside of this range. Acetaminophen concentrations >150 ug/mL at 4 hours after ingestion  and >50 ug/mL at 12 hours after ingestion are often associated with  toxic reactions. Performed at [facility], [address] [place], [place] [address]   Ethanol     Status: None   Collection Time: 08/06/19  8:10 PM  Result Value Ref Range   Alcohol, Ethyl (B) <10 <10 mg/dL    Comment: (NOTE) Lowest detectable limit for serum alcohol is 10 mg/dL. For medical purposes only. Performed at [facility], [address] [place], [place] [address]   Salicylate level     Status: Abnormal   Collection Time: 08/06/19  8:10 PM  Result Value Ref Range   Salicylate Lvl <7.0 (L) 7.0 - 30.0 mg/dL    Comment: Performed at [facility], [address] [place], [place] [address]  Urine Drug Screen, Qualitative     Status: Abnormal   Collection Time: 08/06/19  8:49 PM  Result Value Ref Range   Tricyclic, Ur Screen POSITIVE (A) NONE DETECTED   Amphetamines, Ur Screen NONE DETECTED NONE DETECTED   MDMA (Ecstasy)Ur Screen NONE DETECTED NONE DETECTED   Cocaine Metabolite,Ur [place] NONE DETECTED NONE DETECTED   Opiate, Ur Screen NONE DETECTED NONE DETECTED   Phencyclidine (PCP) Ur S NONE DETECTED NONE DETECTED   Cannabinoid 50 Ng, Ur [place] NONE DETECTED NONE DETECTED   Barbiturates, Ur Screen NONE DETECTED NONE DETECTED   Benzodiazepine, Ur Scrn NONE DETECTED NONE DETECTED   Methadone Scn, Ur NONE DETECTED NONE DETECTED    Comment: (NOTE) Tricyclics + metabolites, urine    Cutoff 1000 ng/mL Amphetamines + metabolites, urine  Cutoff 1000 ng/mL MDMA (Ecstasy), urine  Cutoff 500 ng/mL Cocaine Metabolite, urine          Cutoff 300 ng/mL Opiate + metabolites, urine        Cutoff 300 ng/mL Phencyclidine (PCP), urine         Cutoff 25 ng/mL Cannabinoid, urine                  Cutoff 50 ng/mL Barbiturates + metabolites, urine  Cutoff 200 ng/mL Benzodiazepine, urine              Cutoff 200 ng/mL Methadone, urine                   Cutoff 300 ng/mL The urine drug screen provides only a preliminary, unconfirmed analytical test result and should not be used for non-medical purposes. Clinical consideration and professional judgment should be applied to any positive drug screen result due to possible interfering substances. A more specific alternate chemical method must be used in order to obtain a confirmed analytical result. Gas chromatography / mass spectrometry (GC/MS) is the preferred confirmat ory method. Performed at [facility], [address] [place], [place] [address]   Pregnancy, urine     Status: None   Collection Time: 08/06/19  9:39 PM  Result Value Ref Range   Preg Test, Ur NEGATIVE NEGATIVE    Comment: Performed at [facility], [address] [place], [place] [address]  Acetaminophen level     Status: Abnormal   Collection Time: 08/06/19 10:55 PM  Result Value Ref Range   Acetaminophen (Tylenol), Serum <10 (L) 10 - 30 ug/mL    Comment: (NOTE) Therapeutic concentrations vary significantly. A range of 10-30 ug/mL  may be an effective concentration for many patients. However, some  are best treated at concentrations outside of this range. Acetaminophen concentrations >150 ug/mL at 4 hours after ingestion  and >50 ug/mL at 12 hours after ingestion are often associated with  toxic reactions. Performed at [facility], [address] [place], [place] [address]   Salicylate level     Status: Abnormal   Collection Time: 08/06/19 10:55 PM  Result Value Ref Range   Salicylate Lvl <7.0 (L) 7.0 - 30.0 mg/dL    Comment: Performed at [facility], [address], [place], [place] [address]  Respiratory Panel by RT PCR (Flu A&B, Covid) - Nasopharyngeal Swab     Status: None   Collection Time: 08/07/19  1:48 AM    Specimen: Nasopharyngeal Swab  Result Value Ref Range   SARS Coronavirus 2 by RT PCR NEGATIVE NEGATIVE    Comment: (NOTE) SARS-CoV-2 target nucleic acids are NOT DETECTED. The SARS-CoV-2 RNA is generally detectable in upper respiratoy specimens during the acute phase of infection. The lowest concentration of SARS-CoV-2 viral copies this assay can detect is 131 copies/mL. A negative result does not preclude SARS-Cov-2 infection and should not be used as the sole basis for treatment or other patient management decisions. A negative result may occur with  improper specimen collection/handling, submission of specimen other than nasopharyngeal swab, presence of viral mutation(s) within the areas targeted by this assay, and inadequate number of viral copies (<131 copies/mL). A negative result must be combined with clinical observations, patient history, and epidemiological information. The expected result is Negative. Fact Sheet for Patients:  https://www.moore.com/ Fact Sheet for Healthcare Providers:  https://www.young.biz/ This test is not yet ap proved or cleared by the [place] FDA and  has been authorized for detection and/or diagnosis of  SARS-CoV-2 by FDA under an Emergency Use Authorization (EUA). This EUA will remain  in effect (meaning this test can be used) for the duration of the COVID-19 declaration under Section 564(b)(1) of the Act, 21 U.S.C. section 360bbb-3(b)(1), unless the authorization is terminated or revoked sooner.    Influenza A by PCR NEGATIVE NEGATIVE   Influenza B by PCR NEGATIVE NEGATIVE    Comment: (NOTE) The Xpert Xpress SARS-CoV-2/FLU/RSV assay is intended as an aid in  the diagnosis of influenza from Nasopharyngeal swab specimens and  should not be used as a sole basis for treatment. Nasal washings and  aspirates are unacceptable for Xpert Xpress SARS-CoV-2/FLU/RSV  testing. Fact Sheet for  Patients: https://www.moore.com/ Fact Sheet for Healthcare Providers: https://www.young.biz/ This test is not yet approved or cleared by the [place] FDA and  has been authorized for detection and/or diagnosis of SARS-CoV-2 by  FDA under an Emergency Use Authorization (EUA). This EUA will remain  in effect (meaning this test can be used) for the duration of the  Covid-19 declaration under Section 564(b)(1) of the Act, 21  U.S.C. section 360bbb-3(b)(1), unless the authorization is  terminated or revoked. Performed at [facility], [address] [place], [place] [address]     Blood Alcohol level:  Lab Results  Component Value Date   [facility] <10 08/06/2019    Metabolic Disorder Labs:  No results found for: HGBA1C, MPG No results found for: PROLACTIN No results found for: CHOL, TRIG, HDL, CHOLHDL, VLDL, LDLCALC  Current Medications: Current Facility-Administered Medications  Medication Dose Route Frequency Provider Last Rate Last Admin  . acetaminophen (TYLENOL) tablet 650 mg  650 mg Oral Q6H PRN Dixon, Rashaun M, NP      . alum & mag hydroxide-simeth (MAALOX/MYLANTA) 200-200-20 MG/5ML suspension 30 mL  30 mL Oral Q4H PRN Dixon, Rashaun M, NP      . magnesium hydroxide (MILK OF MAGNESIA) suspension 30 mL  30 mL Oral Daily PRN Dixon, Rashaun M, NP      . sertraline (ZOLOFT) tablet 50 mg  50 mg Oral Daily Lorilynn Lehr T, MD      . traZODone (DESYREL) tablet 50 mg  50 mg Oral QHS Mohid Furuya T, MD       PTA Medications: Medications Prior to Admission  Medication Sig Dispense Refill Last Dose  . neomycin-polymyxin-hydrocortisone (CORTISPORIN) 3.5-10000-1 OTIC suspension Place 4 drops into the left ear 3 (three) times daily. X 5 days 10 mL 0   . Prenatal Vit-Fe Fumarate-FA (MULTIVITAMIN-PRENATAL) 27-0.8 MG TABS tablet Take 1 tablet by mouth daily at 12 noon.     . sertraline (ZOLOFT) 50 MG tablet Take 25 mg by mouth daily.        Musculoskeletal: Strength & Muscle Tone: within normal limits Gait & Station: normal Patient leans: N/A  Psychiatric Specialty Exam: Physical Exam  Nursing note and vitals reviewed. Constitutional: She appears well-developed and well-nourished.  HENT:  Head: Normocephalic and atraumatic.  Eyes: Pupils are equal, round, and reactive to light. Conjunctivae are normal.  Cardiovascular: Regular rhythm and normal heart sounds.  Respiratory: Effort normal.  GI: Soft.  Musculoskeletal:        General: Normal range of motion.     Cervical back: Normal range of motion.  Neurological: She is alert.  Skin: Skin is warm and dry.  Psychiatric: Judgment normal. Her mood appears anxious. Her speech is delayed. She is slowed and withdrawn. Cognition and memory are normal. She exhibits a depressed mood. She expresses  suicidal ideation. She expresses no suicidal plans.    Review of Systems  Constitutional: Negative.   HENT: Negative.   Eyes: Negative.   Respiratory: Negative.   Cardiovascular: Negative.   Gastrointestinal: Negative.   Musculoskeletal: Negative.   Skin: Negative.   Neurological: Negative.   Psychiatric/Behavioral: Positive for behavioral problems, dysphoric mood, self-injury, sleep disturbance and suicidal ideas. The patient is nervous/anxious.     Blood pressure 118/86, pulse 89, temperature 98 F (36.7 C), temperature source Oral, resp. rate 18, height 5\' 3"  (1.6 m), weight 69.4 kg, SpO2 99 %, currently breastfeeding.Body mass index is 27.1 kg/m.  General Appearance: Casual  Eye Contact:  Good  Speech:  Slow  Volume:  Decreased  Mood:  Anxious and Depressed  Affect:  Blunt and Congruent  Thought Process:  Coherent  Orientation:  Full (Time, Place, and Person)  Thought Content:  Logical, Obsessions and Rumination  Suicidal Thoughts:  Yes.  without intent/plan  Homicidal Thoughts:  No  Memory:  Immediate;   Fair Recent;   Fair Remote;   Fair  Judgement:  Fair   Insight:  Fair  Psychomotor Activity:  Decreased  Concentration:  Concentration: Fair  Recall:  [employer] of Knowledge:  Fair  Language:  Fair  Akathisia:  No  Handed:  Right  AIMS (if indicated):     Assets:  Communication Skills Desire for Improvement Financial Resources/Insurance Housing Physical Health Resilience Social Support  ADL's:  Intact  Cognition:  WNL  Sleep:       Treatment Plan Summary: Daily contact with patient to assess and evaluate symptoms and progress in treatment, Medication management and Plan Patient with what sounds like multiple symptoms of major depression although with very acute onset.  Severe anxiety symptoms including obsessive rumination.  No real specific compulsive behavior identified.  Patient is not having psychotic symptoms and has good insight and is cooperative.  Psychoeducation completed.  Increase Zoloft to 50 mg/day.  Add trazodone 50 mg at night.  Engage in individual and group therapy.  Work on making sure there is appropriate discharge planning in place.  I have ordered the full set of blood labs  Observation Level/Precautions:  15 minute checks  Laboratory:  Chemistry Profile  Psychotherapy:    Medications:    Consultations:    Discharge Concerns:    Estimated LOS:  Other:     Physician Treatment Plan for Primary Diagnosis: Severe recurrent major depression without psychotic features ([place]) Long Term Goal(s): Improvement in symptoms so as ready for discharge  Short Term Goals: Ability to verbalize feelings will improve, Ability to disclose and discuss suicidal ideas and Ability to demonstrate self-control will improve  Physician Treatment Plan for Secondary Diagnosis: Principal Problem:   Severe recurrent major depression without psychotic features ([place]) Active Problems:   OCD (obsessive compulsive disorder)   Diphenhydramine overdose  Long Term Goal(s): Improvement in symptoms so as ready for discharge  Short Term Goals:  Compliance with prescribed medications will improve  I certify that inpatient services furnished can reasonably be expected to improve the patient's condition.    Alethia Berthold, MD 5/3/20212:00 PM

## 2019-08-07 NOTE — Progress Notes (Signed)
Patient admitted from ED with depression and anxiety.Patient is depressed but  cooperative during admission assessment.Patient lives with her husband and 45 month old baby.Patient did not say any triggering reason for her depression and anxiety. Patient verbalized thoughts of hurting herself.Contracts for safety. Patient denies HI & AVH. Patient informed of fall risk status, fall risk assessed "low" at this time. Patient oriented to unit/staff/room. Patient denies any questions/concerns at this time. Patient safe on unit with Q15 minute checks for safety.Skin assessment and body search done,no contraband found.

## 2019-08-07 NOTE — BH Assessment (Signed)
Patient is to be admitted to [facility] BMU byDr. Clapacs.  Attending Physician will be.Cindy Hazy, MD.  Patient has been assigned to room302, by [facility] Charge NurseGigi, RN.  Intake Paper Work has been signed and placed on patient chart.  ER staff is aware of the admission: 1. Nitchia, ER Secretary  2. Fuller Plan, ER MD  3. Geralynn Ochs Patient's Nurse  4. THO Patient Access.

## 2019-08-07 NOTE — ED Notes (Signed)
Patient has been appropriate and cooperative this shift, no negative behaviors this shift

## 2019-08-07 NOTE — BHH Suicide Risk Assessment (Signed)
[facility] Admission Suicide Risk Assessment   Nursing information obtained from:  Patient Demographic factors:  Caucasian, Unemployed, Adolescent or young adult Current Mental Status:  Self-harm thoughts Loss Factors:  NA Historical Factors:  Impulsivity Risk Reduction Factors:  Responsible for children under 27 years of age, Living with another person, especially a relative  Total Time spent with patient: 1 hour Principal Problem: Severe recurrent major depression without psychotic features (HCC) Diagnosis:  Principal Problem:   Severe recurrent major depression without psychotic features (HCC) Active Problems:   OCD (obsessive compulsive disorder)   Diphenhydramine overdose  Subjective Data: Patient seen and chart reviewed patient with depression and anxiety took intentional overdose of Benadryl.  She says that she was not trying to kill herself but she also admits to having had suicidal thoughts recently.  Not psychotic.  Good insight and agreeable to treatment.  Continued Clinical Symptoms:  Alcohol Use Disorder Identification Test Final Score (AUDIT): 1 The "Alcohol Use Disorders Identification Test", Guidelines for Use in Primary Care, Second Edition.  World Science writer [facility]). Score between 0-7:  no or low risk or alcohol related problems. Score between 8-15:  moderate risk of alcohol related problems. Score between 16-19:  high risk of alcohol related problems. Score 20 or above:  warrants further diagnostic evaluation for alcohol dependence and treatment.   CLINICAL FACTORS:   Severe Anxiety and/or Agitation Depression:   Anhedonia Impulsivity Insomnia   Musculoskeletal: Strength & Muscle Tone: within normal limits Gait & Station: normal Patient leans: N/A  Psychiatric Specialty Exam: Physical Exam  Nursing note and vitals reviewed. Constitutional: She appears well-developed and well-nourished.  HENT:  Head: Normocephalic and atraumatic.  Eyes: Pupils are  equal, round, and reactive to light. Conjunctivae are normal.  Cardiovascular: Regular rhythm and normal heart sounds.  Respiratory: Effort normal.  GI: Soft.  Musculoskeletal:        General: Normal range of motion.     Cervical back: Normal range of motion.  Neurological: She is alert.  Skin: Skin is warm and dry.  Psychiatric: Her mood appears anxious. Her speech is delayed. She is slowed. Thought content is not paranoid. She expresses impulsivity. She exhibits a depressed mood. She expresses suicidal ideation. She expresses no homicidal ideation. She expresses no suicidal plans. She exhibits abnormal recent memory.    Review of Systems  Constitutional: Negative.   HENT: Negative.   Eyes: Negative.   Respiratory: Negative.   Cardiovascular: Negative.   Gastrointestinal: Negative.   Musculoskeletal: Negative.   Skin: Negative.   Neurological: Negative.   Psychiatric/Behavioral: Positive for behavioral problems, confusion, dysphoric mood, self-injury, sleep disturbance and suicidal ideas. The patient is nervous/anxious.     Blood pressure 118/86, pulse 89, temperature 98 F (36.7 C), temperature source Oral, resp. rate 18, height 5\' 3"  (1.6 m), weight 69.4 kg, SpO2 99 %, currently breastfeeding.Body mass index is 27.1 kg/m.  General Appearance: Casual  Eye Contact:  Good  Speech:  Slow  Volume:  Decreased  Mood:  Anxious and Depressed  Affect:  Congruent  Thought Process:  Coherent  Orientation:  Full (Time, Place, and Person)  Thought Content:  Logical  Suicidal Thoughts:  Yes.  without intent/plan  Homicidal Thoughts:  No  Memory:  Immediate;   Fair Recent;   Fair Remote;   Fair  Judgement:  Fair  Insight:  Fair  Psychomotor Activity:  Decreased  Concentration:  Concentration: Fair  Recall:  of Knowledge:  Fair  Language:  Fair  Akathisia:  No  Handed:  Right  AIMS (if indicated):     Assets:  Communication Skills Desire for Improvement Financial  Resources/Insurance Housing Physical Health Resilience Social Support  ADL's:  Impaired  Cognition:  WNL  Sleep:         COGNITIVE FEATURES THAT CONTRIBUTE TO RISK:  None    SUICIDE RISK:   Mild:  Suicidal ideation of limited frequency, intensity, duration, and specificity.  There are no identifiable plans, no associated intent, mild dysphoria and related symptoms, good self-control (both objective and subjective assessment), few other risk factors, and identifiable protective factors, including available and accessible social support.  PLAN OF CARE: 15-minute checks.  Restart antidepressant and antianxiety medicine.  Include in individual and group therapy and treatment team evaluation.  Arrange for safe outpatient treatment.  I certify that inpatient services furnished can reasonably be expected to improve the patient's condition.   Alethia Berthold, MD 08/07/2019, 1:56 PM

## 2019-08-07 NOTE — Consult Note (Addendum)
[facility] Face-to-Face Psychiatry Consult   Reason for Consult:  Psych evaluation  Referring Physician:  Dr. Erma Heritage Patient Identification: Amanda Fisher MRN:  [id number] Principal Diagnosis: Suicide attempt by drug ingestion [facility]) Diagnosis:  Principal Problem:   Suicide attempt by drug ingestion (HCC)   Total Time spent with patient: 45 minutes  Subjective:   Amanda Fisher is a 29 y.o. female patient admitted with Per-er nurse: Pt to ED via POV, pt states about 1 hour PTA she took about 8-10 benadryl. Pt states that she did this in an attempt to harm herself. Pt has hx/o PPD. Pt was on Zoloft and then stopped taking it. Pt has been back on zoloft for about 4 days.  HPI:  Amanda Fisher, 29 y.o., female patient seen face to face by this provider; chart reviewed and consulted with Dr. Lucianne Muss on 08/07/19.  On evaluation Amanda Fisher reports that she has been feeling depressed and anxious for the last two weeks.  Tonight she states that she took two benadryl pills to get some sleep. She states that her husband had the baby so then she decided to take the rest in the bottle due to feeling anxious and overwhelmed. When asked why, patient states she wanted to get help. Patient is currently on 50mg  of Zoloft daily and states that it was beneficial so she stopped taking it. She reports restarting Zoloft approximately a week ago.  Educated patient on the risk of starting and stopping medication on her own. Patient denies  Prior psychiatric hospitalizations or having a psychiatrist.   At this time patient still endorses MDD symptoms and is recommended psychiatric hospitalization. Patient agrees with this plan.  During evaluation Amanda Fisher is laying in bed and is still very lethargic from the ingestion of benedryll; she is alert/oriented x 4; calm/cooperative; and mood congruent with affect.  Patient is speaking in a clear tone at moderate volume, and normal pace; with poor eye contact.  Her thought process is  coherent and relevant; There is no indication that she is currently responding to internal/external stimuli or experiencing delusional thought content.  Patient endorses suicidal/self-harm ideations and there is no evidence of , psychosis or paranoia.  Patient has remained calm throughout assessment and has answered questions appropriately.   Past Psychiatric History: Depression and anxiety  Risk to Self:   Risk to Others:   Prior Inpatient Therapy:   Prior Outpatient Therapy:    Past Medical History:  Past Medical History:  Diagnosis Date  . Acid reflux   . Asthma   . Depression   . Hypoglycemia     Past Surgical History:  Procedure Laterality Date  . TONSILLECTOMY AND ADENOIDECTOMY    . WISDOM TOOTH EXTRACTION  2013   Family History:  Family History  Problem Relation Age of Onset  . Cancer Mother 20       thyroid  . Thyroid disease Mother   . Hypertension Mother   . Cancer Father 75       appendix - ca caused them to rupture  . Hypertension Father   . Cancer Paternal Grandmother 55       cervical   Family Psychiatric  History: unknown Social History:  Social History   Substance and Sexual Activity  Alcohol Use Not Currently   Comment: occasionally     Social History   Substance and Sexual Activity  Drug Use No    Social History   Socioeconomic History  . Marital status: Married    Spouse  name: Not on file  . Number of children: 1  . Years of education: 43  . Highest education level: Not on file  Occupational History  . Occupation: food and minor maintenance    Employer: [employer]  Tobacco Use  . Smoking status: Never Smoker  . Smokeless tobacco: Never Used  Substance and Sexual Activity  . Alcohol use: Not Currently    Comment: occasionally  . Drug use: No  . Sexual activity: Yes    Birth control/protection: None  Other Topics Concern  . Not on file  Social History Narrative  . Not on file   Social Determinants of Health   Financial Resource  Strain:   . Difficulty of Paying Living Expenses:   Food Insecurity:   . Worried About Programme researcher, broadcasting/film/video in the Last Year:   . Barista in the Last Year:   Transportation Needs:   . Freight forwarder (Medical):   Marland Kitchen Lack of Transportation (Non-Medical):   Physical Activity:   . Days of Exercise per Week:   . Minutes of Exercise per Session:   Stress:   . Feeling of Stress :   Social Connections:   . Frequency of Communication with Friends and Family:   . Frequency of Social Gatherings with Friends and Family:   . Attends Religious Services:   . Active Member of Clubs or Organizations:   . Attends Banker Meetings:   Marland Kitchen Marital Status:    Additional Social History:    Allergies:   Allergies  Allergen Reactions  . Augmentin [Amoxicillin-Pot Clavulanate]   . Other     SEASONAL  . Promethazine Other (See Comments)    Shaking, restless legs  . Tamiflu [Oseltamivir] Other (See Comments)    depression  . Fluoxetine Rash    Labs:  Results for orders placed or performed during the hospital encounter of 08/06/19 (from the past 48 hour(s))  Comprehensive metabolic panel     Status: None   Collection Time: 08/06/19  6:43 PM  Result Value Ref Range   Sodium 140 135 - 145 mmol/L   Potassium 3.9 3.5 - 5.1 mmol/L    Comment: HEMOLYSIS AT THIS LEVEL MAY AFFECT RESULT   Chloride 105 98 - 111 mmol/L   CO2 24 22 - 32 mmol/L   Glucose, Bld 89 70 - 99 mg/dL    Comment: Glucose reference range applies only to samples taken after fasting for at least 8 hours.   BUN 13 6 - 20 mg/dL   Creatinine, Ser 8.08 0.44 - 1.00 mg/dL   Calcium 9.7 8.9 - 81.1 mg/dL   Total Protein 8.1 6.5 - 8.1 g/dL   Albumin 4.5 3.5 - 5.0 g/dL   AST 24 15 - 41 U/L   ALT 16 0 - 44 U/L   Alkaline Phosphatase 61 38 - 126 U/L   Total Bilirubin 0.8 0.3 - 1.2 mg/dL   GFR calc non Af Amer >60 >60 mL/min   GFR calc Af Amer >60 >60 mL/min   Anion gap 11 5 - 15    Comment: Performed at [facility], [address] [place], [place] [address]  cbc     Status: Abnormal   Collection Time: 08/06/19  6:43 PM  Result Value Ref Range   WBC 9.4 4.0 - 10.5 K/uL   RBC 5.13 (H) 3.87 - 5.11 MIL/uL   Hemoglobin 13.9 12.0 - 15.0 g/dL   HCT 45.8 59.2 - 92.4 %  MCV 83.4 80.0 - 100.0 fL   MCH 27.1 26.0 - 34.0 pg   MCHC 32.5 30.0 - 36.0 g/dL   RDW 84.1 66.0 - 63.0 %   Platelets 165 150 - 400 K/uL   nRBC 0.0 0.0 - 0.2 %    Comment: Performed at [facility], [address] [place], [place] [address]  Acetaminophen level     Status: Abnormal   Collection Time: 08/06/19  8:10 PM  Result Value Ref Range   Acetaminophen (Tylenol), Serum <10 (L) 10 - 30 ug/mL    Comment: (NOTE) Therapeutic concentrations vary significantly. A range of 10-30 ug/mL  may be an effective concentration for many patients. However, some  are best treated at concentrations outside of this range. Acetaminophen concentrations >150 ug/mL at 4 hours after ingestion  and >50 ug/mL at 12 hours after ingestion are often associated with  toxic reactions. Performed at [facility], [address] [place], [place] [address]   Ethanol     Status: None   Collection Time: 08/06/19  8:10 PM  Result Value Ref Range   Alcohol, Ethyl (B) <10 <10 mg/dL    Comment: (NOTE) Lowest detectable limit for serum alcohol is 10 mg/dL. For medical purposes only. Performed at [facility], [address] [place], [place] [address]   Salicylate level     Status: Abnormal   Collection Time: 08/06/19  8:10 PM  Result Value Ref Range   Salicylate Lvl <7.0 (L) 7.0 - 30.0 mg/dL    Comment: Performed at [facility], [address], [place], [place] [address]  Urine Drug Screen, Qualitative     Status: Abnormal   Collection Time: 08/06/19  8:49 PM  Result Value Ref Range   Tricyclic, Ur Screen POSITIVE (A) NONE DETECTED   Amphetamines, Ur Screen NONE DETECTED NONE DETECTED   MDMA  (Ecstasy)Ur Screen NONE DETECTED NONE DETECTED   Cocaine Metabolite,Ur Pender NONE DETECTED NONE DETECTED   Opiate, Ur Screen NONE DETECTED NONE DETECTED   Phencyclidine (PCP) Ur S NONE DETECTED NONE DETECTED   Cannabinoid 50 Ng, Ur  NONE DETECTED NONE DETECTED   Barbiturates, Ur Screen NONE DETECTED NONE DETECTED   Benzodiazepine, Ur Scrn NONE DETECTED NONE DETECTED   Methadone Scn, Ur NONE DETECTED NONE DETECTED    Comment: (NOTE) Tricyclics + metabolites, urine    Cutoff 1000 ng/mL Amphetamines + metabolites, urine  Cutoff 1000 ng/mL MDMA (Ecstasy), urine              Cutoff 500 ng/mL Cocaine Metabolite, urine          Cutoff 300 ng/mL Opiate + metabolites, urine        Cutoff 300 ng/mL Phencyclidine (PCP), urine         Cutoff 25 ng/mL Cannabinoid, urine                 Cutoff 50 ng/mL Barbiturates + metabolites, urine  Cutoff 200 ng/mL Benzodiazepine, urine              Cutoff 200 ng/mL Methadone, urine                   Cutoff 300 ng/mL The urine drug screen provides only a preliminary, unconfirmed analytical test result and should not be used for non-medical purposes. Clinical consideration and professional judgment should be applied to any positive drug screen result due to possible interfering substances. A more specific alternate chemical method must be used in order to  obtain a confirmed analytical result. Gas chromatography / mass spectrometry (GC/MS) is the preferred confirmat ory method. Performed at [facility], [address] [place], [place] [address]   Pregnancy, urine     Status: None   Collection Time: 08/06/19  9:39 PM  Result Value Ref Range   Preg Test, Ur NEGATIVE NEGATIVE    Comment: Performed at [facility], [address] [place], [place] [address]  Acetaminophen level     Status: Abnormal   Collection Time: 08/06/19 10:55 PM  Result Value Ref Range   Acetaminophen (Tylenol), Serum <10 (L) 10 - 30 ug/mL    Comment:  (NOTE) Therapeutic concentrations vary significantly. A range of 10-30 ug/mL  may be an effective concentration for many patients. However, some  are best treated at concentrations outside of this range. Acetaminophen concentrations >150 ug/mL at 4 hours after ingestion  and >50 ug/mL at 12 hours after ingestion are often associated with  toxic reactions. Performed at [facility], [address] [place], [place] [address]   Salicylate level     Status: Abnormal   Collection Time: 08/06/19 10:55 PM  Result Value Ref Range   Salicylate Lvl <7.0 (L) 7.0 - 30.0 mg/dL    Comment: Performed at [facility], [address] [place], [place] [address]    No current facility-administered medications for this encounter.   Current Outpatient Medications  Medication Sig Dispense Refill  . Prenatal Vit-Fe Fumarate-FA (MULTIVITAMIN-PRENATAL) 27-0.8 MG TABS tablet Take 1 tablet by mouth daily at 12 noon.    . sertraline (ZOLOFT) 50 MG tablet Take 25 mg by mouth daily.    Marland Kitchen. neomycin-polymyxin-hydrocortisone (CORTISPORIN) 3.5-10000-1 OTIC suspension Place 4 drops into the left ear 3 (three) times daily. X 5 days 10 mL 0    Musculoskeletal: Strength & Muscle Tone: within normal limits Gait & Station: normal Patient leans: N/A  Psychiatric Specialty Exam: Physical Exam  Nursing note and vitals reviewed. Constitutional: She is oriented to person, place, and time. She appears well-developed.  HENT:  Head: Normocephalic.  Eyes: Pupils are equal, round, and reactive to light.  Respiratory: Effort normal.  Musculoskeletal:        General: Normal range of motion.     Cervical back: Normal range of motion.  Neurological: She is alert and oriented to person, place, and time. She has normal reflexes.  Skin: Skin is warm and dry.  Psychiatric: Her speech is normal and behavior is normal. Her mood appears anxious. Cognition and memory are normal. She expresses impulsivity. She  exhibits a depressed mood. She expresses suicidal ideation.    Review of Systems  Psychiatric/Behavioral: Positive for suicidal ideas. Negative for confusion and hallucinations. The patient is nervous/anxious. The patient is not hyperactive.   All other systems reviewed and are negative.   Blood pressure 111/75, pulse 79, temperature (!) 97.5 F (36.4 C), temperature source Oral, resp. rate 19, height 5\' 3"  (1.6 m), weight 70.8 kg, SpO2 100 %, currently breastfeeding.Body mass index is 27.63 kg/m.  General Appearance: Casual  Eye Contact:  Poor  Speech:  Clear and Coherent  Volume:  Normal  Mood:  Anxious and Depressed  Affect:  Congruent  Thought Process:  Coherent and Descriptions of Associations: Intact  Orientation:  Full (Time, Place, and Person)  Thought Content:  Logical  Suicidal Thoughts:  Yes.  with intent/plan  Homicidal Thoughts:  No  Memory:  Immediate;   Good  Judgement:  Impaired  Insight:  Lacking  Psychomotor Activity:  Normal  Concentration:  Concentration: Fair  Recall:  [employer] of Knowledge:  Fair  Language:  Good  Akathisia:  NA  Handed:  Right  AIMS (if indicated):     Assets:  Communication Skills Desire for Improvement Social Support  ADL's:  Intact  Cognition:  WNL  Sleep:        Treatment Plan Summary: Daily contact with patient to assess and evaluate symptoms and progress in treatment, Medication management and zoloft 50mg  daily  Disposition: Recommend psychiatric Inpatient admission when medically cleared. Supportive therapy provided about ongoing stressors.  Deloria Lair, NP 08/07/2019 12:25 AM

## 2019-08-07 NOTE — ED Provider Notes (Signed)
Emergency Medicine Observation Re-evaluation Note  Amanda Fisher is a 29 y.o. female, seen on rounds today.  Pt initially presented to the ED for complaints of Ingestion and Suicidal Currently, the patient is is no acute distress   Physical Exam  BP 116/64 (BP Location: Right Arm)   Pulse 77   Temp 98.2 F (36.8 C) (Oral)   Resp 19   Ht 5\' 3"  (1.6 m)   Wt 70.8 kg   SpO2 97%   BMI 27.63 kg/m  Physical Exam  ED Course / MDM  EKG:    I have reviewed the labs performed to date as well as medications administered while in observation.   Plan  Current plan is for psych admission Patient is under full IVC at this time.   , MD 08/07/19 [phone]

## 2019-08-07 NOTE — BHH Group Notes (Signed)
Balance In Life 08/07/2019 1PM  Type of Therapy/Topic:  Group Therapy:  Balance in Life  Participation Level:  Did Not Attend  Description of Group:   This group will address the concept of balance and how it feels and looks when one is unbalanced. Patients will be encouraged to process areas in their lives that are out of balance and identify reasons for remaining unbalanced. Facilitators will guide patients in utilizing problem-solving interventions to address and correct the stressor making their life unbalanced. Understanding and applying boundaries will be explored and addressed for obtaining and maintaining a balanced life. Patients will be encouraged to explore ways to assertively make their unbalanced needs known to significant others in their lives, using other group members and facilitator for support and feedback.  Therapeutic Goals: 1. Patient will identify two or more emotions or situations they have that consume much of in their lives. 2. Patient will identify signs/triggers that life has become out of balance:  3. Patient will identify two ways to set boundaries in order to achieve balance in their lives:  4. Patient will demonstrate ability to communicate their needs through discussion and/or role plays  Summary of Patient Progress:    Therapeutic Modalities:   Cognitive Behavioral Therapy Solution-Focused Therapy Assertiveness Training  Kenniel Bergsma T Alexandrina Fiorini, LCSW  

## 2019-08-07 NOTE — BH Assessment (Addendum)
Patient is to be admitted to [facility] by Psychiatric Nurse Practitioner Rishaun Dixono. Patient to be transferred to psychiatric unit after 8:30am. Attending Physician will be Dr. Toni Amend.   Patient has been assigned to room 302, by [facility] Charge Nurse Britta Mccreedy.    ER staff is aware of the admission:  [facility] ER Secretary    Dr. Penne Lash, ER MD   Dewayne Hatch Patient's Nurse    Patient Access.

## 2019-08-07 NOTE — ED Notes (Signed)
Patient transferred to [facility] room 302, with Psychologist, counselling. Patient has no belongings husband took them.. Patient appropriate and cooperative, Denies SI/HI AVH. Vital signs taken. NAD noted.

## 2019-08-07 NOTE — ED Notes (Signed)
Hourly rounding reveals patient sleeping in room. No complaints, stable, in no acute distress. Q15 minute rounds and monitoring via Security Cameras to continue. 

## 2019-08-07 NOTE — BH Assessment (Signed)
Assessment Note  Amanda Fisher is a  29 y.o. Caucasian  female who presents to the [facility] ED with a history of Depression and Anxiety. During the assessment patient reported "I wish I was dead". Patient reports that she took 8-10 Benadryl due to feeling "overwhelmed". Patient denies substance use other occasional alcohol.  Patient reports that she recently gave birth and has been experiencing extreme sadness. Patient endorsed taking 6-7 Ibuprofen the other day as well for the same reasons. Patient denies HI and Visual/Auditory Hallucinations.Patient endorsed that she has struggled with Anxiety/Depression since the age of 41 and recently started seeing a counselor a week ago. Patient also endorsed recently starting back on a prescription of Zoloft. Patient meets criteria for inpatient criteria and is willing to sign herself in.   Diagnosis: Major Depressive Disorder  Past Medical History:  Past Medical History:  Diagnosis Date  . Acid reflux   . Asthma   . Depression   . Hypoglycemia     Past Surgical History:  Procedure Laterality Date  . TONSILLECTOMY AND ADENOIDECTOMY    . WISDOM TOOTH EXTRACTION  2013    Family History:  Family History  Problem Relation Age of Onset  . Cancer Mother 20       thyroid  . Thyroid disease Mother   . Hypertension Mother   . Cancer Father 54       appendix - ca caused them to rupture  . Hypertension Father   . Cancer Paternal Grandmother 44       cervical    Social History:  reports that she has never smoked. She has never used smokeless tobacco. She reports previous alcohol use. She reports that she does not use drugs.  Additional Social History:  Alcohol / Drug Use Pain Medications: SEE MAR Prescriptions: SEE MAR History of alcohol / drug use?: No history of alcohol / drug abuse  CIWA: CIWA-Ar BP: 105/67 Pulse Rate: 79 COWS:    Allergies:  Allergies  Allergen Reactions  . Augmentin [Amoxicillin-Pot Clavulanate]   . Other     SEASONAL   . Promethazine Other (See Comments)    Shaking, restless legs  . Tamiflu [Oseltamivir] Other (See Comments)    depression  . Fluoxetine Rash    Home Medications: (Not in a hospital admission)   OB/GYN Status:  No LMP recorded.  General Assessment Data Location of Assessment: [facility] ED TTS Assessment: In system Is this a Tele or Face-to-Face Assessment?: Tele Assessment Is this an Initial Assessment or a Re-assessment for this encounter?: Initial Assessment Patient Accompanied by:: N/A Language Other than English: No Living Arrangements: (Private Home) What gender do you identify as?: Female Marital status: Married [name]: (Unknown) Pregnancy Status: No Living Arrangements: Spouse/significant other, Children Can pt return to current living arrangement?: Yes Admission Status: Voluntary Is patient capable of signing voluntary admission?: Yes Referral Source: Self/Family/Friend Insurance type: (Medicaid)  Medical Screening Exam [facility] Walk-in ONLY) Medical Exam completed: Yes  Crisis Care Plan Living Arrangements: Spouse/significant other, Children Legal Guardian: (Self) Name of Psychiatrist: (Unknown) Name of Therapist: (Unknown)  Education Status Is patient currently in school?: No Is the patient employed, unemployed or receiving disability?: (Unknown)  Risk to self with the past 6 months Suicidal Ideation: Yes-Currently Present Has patient been a risk to self within the past 6 months prior to admission? : Yes Suicidal Intent: Yes-Currently Present Has patient had any suicidal intent within the past 6 months prior to admission? : Yes Is patient at risk for  suicide?: Yes Suicidal Plan?: Yes-Currently Present Has patient had any suicidal plan within the past 6 months prior to admission? : Yes Specify Current Suicidal Plan: (Plan to overdose) Access to Means: No What has been your use of drugs/alcohol within the last 12 months?: (Unknown) Previous  Attempts/Gestures: (Unknown) How many times?: (Unknown) Other Self Harm Risks: (Unknown) Triggers for Past Attempts: Unknown Intentional Self Injurious Behavior: Damaging(Punching ) Comment - Self Injurious Behavior: (Punching) Family Suicide History: Unknown Recent stressful life event(s): (Just gave birth) Persecutory voices/beliefs?: No Depression: Yes Depression Symptoms: Tearfulness, Loss of interest in usual pleasures Substance abuse history and/or treatment for substance abuse?: No Suicide prevention information given to non-admitted patients: Yes  Risk to Others within the past 6 months Homicidal Ideation: No Does patient have any lifetime risk of violence toward others beyond the six months prior to admission? : Unknown Thoughts of Harm to Others: No Current Homicidal Intent: No Current Homicidal Plan: No Access to Homicidal Means: No Identified Victim: (n/a) History of harm to others?: No Assessment of Violence: None Noted Violent Behavior Description: (Unknown) Does patient have access to weapons?: No Criminal Charges Pending?: No Does patient have a court date: No Is patient on probation?: No  Psychosis Hallucinations: None noted Delusions: None noted  Mental Status Report Appearance/Hygiene: Poor hygiene, In hospital gown, Disheveled Eye Contact: Poor Motor Activity: Unable to assess Speech: Slow, Soft Level of Consciousness: Quiet/awake, Drowsy, Sedated Mood: Apathetic, Depressed, Anxious, Empty, Sad Affect: Apathetic, Depressed Anxiety Level: Minimal Thought Processes: Coherent, Relevant, Thought Blocking Judgement: Partial Orientation: Person, Place, Time, Situation, Appropriate for developmental age Obsessive Compulsive Thoughts/Behaviors: Unable to Assess  Cognitive Functioning Concentration: Fair Memory: Recent Intact, Remote Intact Is patient IDD: No Insight: Fair Impulse Control: Unable to Assess Appetite: Poor Have you had any weight  changes? : No Change Sleep: No Change Total Hours of Sleep: (8) Vegetative Symptoms: Unable to Assess  ADLScreening [facility] Assessment Services) Patient's cognitive ability adequate to safely complete daily activities?: Yes Patient able to express need for assistance with ADLs?: Yes Independently performs ADLs?: Yes (appropriate for developmental age)  Prior Inpatient Therapy Prior Inpatient Therapy: No  Prior Outpatient Therapy Prior Outpatient Therapy: Yes Prior Therapy Dates: (Unknown) Prior Therapy Facilty/Provider(s): (Unknown) Reason for Treatment: (Postpartum ) Does patient have an ACCT team?: No Does patient have Intensive In-House Services?  : Unknown Does patient have [facility] services? : Unknown Does patient have P4CC services?: Unknown  ADL Screening (condition at time of admission) Patient's cognitive ability adequate to safely complete daily activities?: Yes Patient able to express need for assistance with ADLs?: Yes Independently performs ADLs?: Yes (appropriate for developmental age)       Abuse/Neglect Assessment (Assessment to be complete while patient is alone) Abuse/Neglect Assessment Can Be Completed: Yes Physical Abuse: Denies Verbal Abuse: Denies Sexual Abuse: Denies Exploitation of patient/patient's resources: Denies Self-Neglect: Denies Values / Beliefs Cultural Requests During Hospitalization: None Spiritual Requests During Hospitalization: None Consults Spiritual Care Consult Needed: No Transition of Care Team Consult Needed: No Advance Directives (For Healthcare) Does Patient Have a Medical Advance Directive?: No Would patient like information on creating a medical advance directive?: No - Patient declined       Child/Adolescent Assessment Running Away Risk: (Unknown) Bed-Wetting: (Unknown) Destruction of Property: (Unknown) Cruelty to Animals: (Unknown) Stealing: (Unknown) Rebellious/Defies Authority: (Unknown) Satanic Involvement:  (Unknown) Fire Setting: (Unknown) Problems at School: (Unknown) Gang Involvement: (Unknown)  Disposition:  Disposition Initial Assessment Completed for this Encounter: Yes Patient referred to: Other (Comment)(Consult  with MD)  On Site Evaluation by:   Reviewed with Physician:    Willene Hatchet, MS., [facility], [facility] 08/07/2019 4:40 AM

## 2019-08-07 NOTE — Tx Team (Signed)
Initial Treatment Plan 08/07/2019 2:27 PM Amanda Fisher ARE:[id number]    PATIENT STRESSORS: Medication change or noncompliance Traumatic event   PATIENT STRENGTHS: Average or above average intelligence Communication skills Motivation for treatment/growth Physical Health Supportive family/friends   PATIENT IDENTIFIED PROBLEMS: Depression  Self harm thoughts                   DISCHARGE CRITERIA:  Ability to meet basic life and health needs Adequate post-discharge living arrangements Medical problems require only outpatient monitoring Motivation to continue treatment in a less acute level of care Verbal commitment to aftercare and medication compliance  PRELIMINARY DISCHARGE PLAN: Attend aftercare/continuing care group Return to previous living arrangement  PATIENT/FAMILY INVOLVEMENT: This treatment plan has been presented to and reviewed with the patient, Amanda Fisher, and/or family member,.  The patient and family have been given the opportunity to ask questions and make suggestions.  Leonarda Salon, RN 08/07/2019, 2:27 PM

## 2019-08-08 NOTE — Plan of Care (Signed)
  Problem: Safety: Goal: Ability to disclose and discuss suicidal ideas will improve Outcome: Progressing Goal: Ability to identify and utilize support systems that promote safety will improve Outcome: Progressing   

## 2019-08-08 NOTE — Tx Team (Signed)
Interdisciplinary Treatment and Diagnostic Plan Update  08/08/2019 Time of Session: 9am Amanda Fisher MRN: [id number]  Principal Diagnosis: Severe recurrent major depression without psychotic features [facility])  Secondary Diagnoses: Principal Problem:   Severe recurrent major depression without psychotic features ([place]) Active Problems:   OCD (obsessive compulsive disorder)   Diphenhydramine overdose   Current Medications:  Current Facility-Administered Medications  Medication Dose Route Frequency Provider Last Rate Last Admin  . acetaminophen (TYLENOL) tablet 650 mg  650 mg Oral Q6H PRN Dixon, Rashaun M, NP      . alum & mag hydroxide-simeth (MAALOX/MYLANTA) 200-200-20 MG/5ML suspension 30 mL  30 mL Oral Q4H PRN Dixon, Rashaun M, NP      . magnesium hydroxide (MILK OF MAGNESIA) suspension 30 mL  30 mL Oral Daily PRN Dixon, Rashaun M, NP      . sertraline (ZOLOFT) tablet 50 mg  50 mg Oral Daily Clapacs, Madie Reno, MD   50 mg at 08/08/19 0092  . traZODone (DESYREL) tablet 50 mg  50 mg Oral QHS Clapacs, Madie Reno, MD   50 mg at 08/07/19 2133   PTA Medications: Medications Prior to Admission  Medication Sig Dispense Refill Last Dose  . neomycin-polymyxin-hydrocortisone (CORTISPORIN) 3.5-10000-1 OTIC suspension Place 4 drops into the left ear 3 (three) times daily. X 5 days 10 mL 0   . Prenatal Vit-Fe Fumarate-FA (MULTIVITAMIN-PRENATAL) 27-0.8 MG TABS tablet Take 1 tablet by mouth daily at 12 noon.     . sertraline (ZOLOFT) 50 MG tablet Take 25 mg by mouth daily.       Patient Stressors: Medication change or noncompliance Traumatic event  Patient Strengths: Average or above average intelligence Communication skills Motivation for treatment/growth Physical Health Supportive family/friends  Treatment Modalities: Medication Management, Group therapy, Case management,  1 to 1 session with clinician, Psychoeducation, Recreational therapy.   Physician Treatment Plan for Primary Diagnosis:  Severe recurrent major depression without psychotic features ([place]) Long Term Goal(s): Improvement in symptoms so as ready for discharge Improvement in symptoms so as ready for discharge   Short Term Goals: Ability to verbalize feelings will improve Ability to disclose and discuss suicidal ideas Ability to demonstrate self-control will improve Compliance with prescribed medications will improve  Medication Management: Evaluate patient's response, side effects, and tolerance of medication regimen.  Therapeutic Interventions: 1 to 1 sessions, Unit Group sessions and Medication administration.  Evaluation of Outcomes: Not Met  Physician Treatment Plan for Secondary Diagnosis: Principal Problem:   Severe recurrent major depression without psychotic features ([place]) Active Problems:   OCD (obsessive compulsive disorder)   Diphenhydramine overdose  Long Term Goal(s): Improvement in symptoms so as ready for discharge Improvement in symptoms so as ready for discharge   Short Term Goals: Ability to verbalize feelings will improve Ability to disclose and discuss suicidal ideas Ability to demonstrate self-control will improve Compliance with prescribed medications will improve     Medication Management: Evaluate patient's response, side effects, and tolerance of medication regimen.  Therapeutic Interventions: 1 to 1 sessions, Unit Group sessions and Medication administration.  Evaluation of Outcomes: Not Met   RN Treatment Plan for Primary Diagnosis: Severe recurrent major depression without psychotic features ([place]) Long Term Goal(s): Knowledge of disease and therapeutic regimen to maintain health will improve  Short Term Goals: Ability to participate in decision making will improve, Ability to verbalize feelings will improve, Ability to disclose and discuss suicidal ideas, Ability to identify and develop effective coping behaviors will improve and Compliance with prescribed medications will  improve  Medication Management: RN will administer medications as ordered by provider, will assess and evaluate patient's response and provide education to patient for prescribed medication. RN will report any adverse and/or side effects to prescribing provider.  Therapeutic Interventions: 1 on 1 counseling sessions, Psychoeducation, Medication administration, Evaluate responses to treatment, Monitor vital signs and CBGs as ordered, Perform/monitor CIWA, COWS, AIMS and Fall Risk screenings as ordered, Perform wound care treatments as ordered.  Evaluation of Outcomes: Not Met   LCSW Treatment Plan for Primary Diagnosis: Severe recurrent major depression without psychotic features ([place]) Long Term Goal(s): Safe transition to appropriate next level of care at discharge, Engage patient in therapeutic group addressing interpersonal concerns.  Short Term Goals: Engage patient in aftercare planning with referrals and resources  Therapeutic Interventions: Assess for all discharge needs, 1 to 1 time with Social worker, Explore available resources and support systems, Assess for adequacy in community support network, Educate family and significant other(s) on suicide prevention, Complete Psychosocial Assessment, Interpersonal group therapy.  Evaluation of Outcomes: Not Met   Progress in Treatment: Attending groups: No. Participating in groups: No. Taking medication as prescribed: Yes. Toleration medication: Yes. Family/Significant other contact made: No, will contact:  when pt gives permission Patient understands diagnosis: Yes. Discussing patient identified problems/goals with staff: Yes. Medical problems stabilized or resolved: No. Denies suicidal/homicidal ideation: Yes. Issues/concerns per patient self-inventory: No. Other: NA  New problem(s) identified: No, Describe:  None reported  New Short Term/Long Term Goal(s):Attend outpatient treatment, develop and implement healthy coping methods,  take medication as prescribed.  Patient Goals:  "Know how to get better so I dont have to come back here"  Discharge Plan or Barriers: Pt will return home and follow up with outpatient provider.  Reason for Continuation of Hospitalization: Depression Medication stabilization  Estimated Length of Stay:1-7 days  Attendees: Patient:Amanda Fisher 08/08/2019 2:28 PM  Physician: Alethia Berthold 08/08/2019 2:28 PM  Nursing: Alvis Lemmings 08/08/2019 2:28 PM  RN Care Manager: 08/08/2019 2:28 PM  Social Worker: Sanjuana Kava [place] 08/08/2019 2:28 PM  Recreational Therapist:  08/08/2019 2:28 PM  Other:  08/08/2019 2:28 PM  Other:  08/08/2019 2:28 PM  Other: 08/08/2019 2:28 PM    Scribe for Treatment Team: Yvette Rack, LCSW 08/08/2019 2:28 PM

## 2019-08-08 NOTE — BHH Group Notes (Signed)
LCSW Group Therapy Note  08/08/2019 1:00 PM  Type of Therapy/Topic:  Group Therapy:  Feelings about Diagnosis  Participation Level:  Did Not Attend   Description of Group:   This group will allow patients to explore their thoughts and feelings about diagnoses they have received. Patients will be guided to explore their level of understanding and acceptance of these diagnoses. Facilitator will encourage patients to process their thoughts and feelings about the reactions of others to their diagnosis and will guide patients in identifying ways to discuss their diagnosis with significant others in their lives. This group will be process-oriented, with patients participating in exploration of their own experiences, giving and receiving support, and processing challenge from other group members.   Therapeutic Goals: 1. Patient will demonstrate understanding of diagnosis as evidenced by identifying two or more symptoms of the disorder 2. Patient will be able to express two feelings regarding the diagnosis 3. Patient will demonstrate their ability to communicate their needs through discussion and/or role play  Summary of Patient Progress: X  Therapeutic Modalities:   Cognitive Behavioral Therapy Brief Therapy Feelings Identification   [name], MSW, LCSW 08/08/2019 12:47 PM 

## 2019-08-08 NOTE — Progress Notes (Signed)
Care of patient taken over at 11pm. Patient in bed resting, eyes closed in no distress. Pt remains safe on unit with q 15 min checks.

## 2019-08-08 NOTE — Progress Notes (Signed)
F - Decrease thoughts of suicide.  D - The patient endorsed an improved mood and denied thoughts of suicide.  She was pleasant upon contact and participated in groups.  The patient was behaviorally appropriate.  No unsafe behaviors observed.  Medications were accepted as ordered.    A - Support provided and the patient endorsed being ready for discharge.  R - Continue with care and plan for discharge.
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
## 2019-08-08 NOTE — Progress Notes (Signed)
[facility] MD Progress Note  08/08/2019 12:52 PM Dvora Buitron  MRN:  [id number] Subjective: Patient seen chart reviewed.  Patient attended treatment team.  She slept better last night and reports that her mood is slightly more stable today.  No active suicidal ideation.  Still feels nervous much of the time with obsessive thinking. Principal Problem: Severe recurrent major depression without psychotic features (HCC) Diagnosis: Principal Problem:   Severe recurrent major depression without psychotic features (HCC) Active Problems:   OCD (obsessive compulsive disorder)   Diphenhydramine overdose  Total Time spent with patient: 30 minutes  Past Psychiatric History: Past history of no prior admissions.  Some outpatient treatment.  Past Medical History:  Past Medical History:  Diagnosis Date  . Acid reflux   . Asthma   . Depression   . Hypoglycemia     Past Surgical History:  Procedure Laterality Date  . TONSILLECTOMY AND ADENOIDECTOMY    . WISDOM TOOTH EXTRACTION  2013   Family History:  Family History  Problem Relation Age of Onset  . Cancer Mother 20       thyroid  . Thyroid disease Mother   . Hypertension Mother   . Cancer Father 70       appendix - ca caused them to rupture  . Hypertension Father   . Cancer Paternal Grandmother 50       cervical   Family Psychiatric  History: See previous Social History:  Social History   Substance and Sexual Activity  Alcohol Use Not Currently   Comment: occasionally     Social History   Substance and Sexual Activity  Drug Use No    Social History   Socioeconomic History  . Marital status: Married    Spouse name: Not on file  . Number of children: 1  . Years of education: 55  . Highest education level: Not on file  Occupational History  . Occupation: food and minor maintenance    Employer: [employer]  Tobacco Use  . Smoking status: Never Smoker  . Smokeless tobacco: Never Used  Substance and Sexual Activity  . Alcohol use:  Not Currently    Comment: occasionally  . Drug use: No  . Sexual activity: Yes    Birth control/protection: None  Other Topics Concern  . Not on file  Social History Narrative  . Not on file   Social Determinants of Health   Financial Resource Strain:   . Difficulty of Paying Living Expenses:   Food Insecurity:   . Worried About Programme researcher, broadcasting/film/video in the Last Year:   . Barista in the Last Year:   Transportation Needs:   . Freight forwarder (Medical):   Marland Kitchen Lack of Transportation (Non-Medical):   Physical Activity:   . Days of Exercise per Week:   . Minutes of Exercise per Session:   Stress:   . Feeling of Stress :   Social Connections:   . Frequency of Communication with Friends and Family:   . Frequency of Social Gatherings with Friends and Family:   . Attends Religious Services:   . Active Member of Clubs or Organizations:   . Attends Banker Meetings:   Marland Kitchen Marital Status:    Additional Social History:                         Sleep: Fair  Appetite:  Fair  Current Medications: Current Facility-Administered Medications  Medication Dose Route Frequency Provider  Last Rate Last Admin  . acetaminophen (TYLENOL) tablet 650 mg  650 mg Oral Q6H PRN Dixon, Rashaun M, NP      . alum & mag hydroxide-simeth (MAALOX/MYLANTA) 200-200-20 MG/5ML suspension 30 mL  30 mL Oral Q4H PRN Dixon, Rashaun M, NP      . magnesium hydroxide (MILK OF MAGNESIA) suspension 30 mL  30 mL Oral Daily PRN Dixon, Rashaun M, NP      . sertraline (ZOLOFT) tablet 50 mg  50 mg Oral Daily Johsua Shevlin, Jackquline Denmark, MD   50 mg at 08/08/19 7124  . traZODone (DESYREL) tablet 50 mg  50 mg Oral QHS Shoshanna Mcquitty, Jackquline Denmark, MD   50 mg at 08/07/19 2133    Lab Results:  Results for orders placed or performed during the hospital encounter of 08/07/19 (from the past 48 hour(s))  Comprehensive metabolic panel     Status: None   Collection Time: 08/07/19  1:53 PM  Result Value Ref Range   Sodium  139 135 - 145 mmol/L   Potassium 4.4 3.5 - 5.1 mmol/L   Chloride 103 98 - 111 mmol/L   CO2 27 22 - 32 mmol/L   Glucose, Bld 86 70 - 99 mg/dL    Comment: Glucose reference range applies only to samples taken after fasting for at least 8 hours.   BUN 10 6 - 20 mg/dL   Creatinine, Ser 5.80 0.44 - 1.00 mg/dL   Calcium 9.3 8.9 - 99.8 mg/dL   Total Protein 8.0 6.5 - 8.1 g/dL   Albumin 4.4 3.5 - 5.0 g/dL   AST 18 15 - 41 U/L   ALT 15 0 - 44 U/L   Alkaline Phosphatase 56 38 - 126 U/L   Total Bilirubin 0.8 0.3 - 1.2 mg/dL   GFR calc non Af Amer >60 >60 mL/min   GFR calc Af Amer >60 >60 mL/min   Anion gap 9 5 - 15    Comment: Performed at [facility], [address] [place], [place] [address]  TSH     Status: None   Collection Time: 08/07/19  1:53 PM  Result Value Ref Range   TSH 1.593 0.350 - 4.500 uIU/mL    Comment: Performed by a 3rd Generation assay with a functional sensitivity of <=0.01 uIU/mL. Performed at [facility], [address] [place], [place] [address]   CBC with Differential/Platelet     Status: Abnormal   Collection Time: 08/07/19  1:53 PM  Result Value Ref Range   WBC 8.4 4.0 - 10.5 K/uL   RBC 5.24 (H) 3.87 - 5.11 MIL/uL   Hemoglobin 14.3 12.0 - 15.0 g/dL   HCT 67.3 41.9 - 37.9 %   MCV 82.3 80.0 - 100.0 fL   MCH 27.3 26.0 - 34.0 pg   MCHC 33.2 30.0 - 36.0 g/dL   RDW 02.4 09.7 - 35.3 %   Platelets 168 150 - 400 K/uL   nRBC 0.0 0.0 - 0.2 %   Neutrophils Relative % 65 %   Neutro Abs 5.5 1.7 - 7.7 K/uL   Lymphocytes Relative 22 %   Lymphs Abs 1.8 0.7 - 4.0 K/uL   Monocytes Relative 8 %   Monocytes Absolute 0.7 0.1 - 1.0 K/uL   Eosinophils Relative 4 %   Eosinophils Absolute 0.4 0.0 - 0.5 K/uL   Basophils Relative 1 %   Basophils Absolute 0.0 0.0 - 0.1 K/uL   Immature Granulocytes 0 %   Abs Immature Granulocytes 0.02 0.00 -  0.07 K/uL    Comment: Performed at [facility], [address] [place], [place] [address]    Blood  Alcohol level:  Lab Results  Component Value Date   [facility] <10 73/71/0626    Metabolic Disorder Labs: No results found for: HGBA1C, MPG No results found for: PROLACTIN No results found for: CHOL, TRIG, HDL, CHOLHDL, VLDL, LDLCALC  Physical Findings: AIMS:  , ,  ,  ,    CIWA:    COWS:     Musculoskeletal: Strength & Muscle Tone: within normal limits Gait & Station: normal Patient leans: N/A  Psychiatric Specialty Exam: Physical Exam  Nursing note and vitals reviewed. Constitutional: She appears well-developed and well-nourished.  HENT:  Head: Normocephalic and atraumatic.  Eyes: Pupils are equal, round, and reactive to light. Conjunctivae are normal.  Cardiovascular: Regular rhythm and normal heart sounds.  Respiratory: Effort normal.  GI: Soft.  Musculoskeletal:        General: Normal range of motion.     Cervical back: Normal range of motion.  Neurological: She is alert.  Skin: Skin is warm and dry.  Psychiatric: Judgment normal. Her affect is blunt. Her speech is delayed. She is slowed. Cognition and memory are normal. She expresses no homicidal and no suicidal ideation.    Review of Systems  Constitutional: Negative.   HENT: Negative.   Eyes: Negative.   Respiratory: Negative.   Cardiovascular: Negative.   Gastrointestinal: Negative.   Musculoskeletal: Negative.   Skin: Negative.   Neurological: Negative.   Psychiatric/Behavioral: Positive for dysphoric mood. Negative for suicidal ideas. The patient is nervous/anxious.     Blood pressure 119/85, pulse (!) 119, temperature 98.7 F (37.1 C), temperature source Oral, resp. rate 17, height 5\' 3"  (1.6 m), weight 69.4 kg, SpO2 98 %, currently breastfeeding.Body mass index is 27.1 kg/m.  General Appearance: Casual  Eye Contact:  Fair  Speech:  Slow  Volume:  Decreased  Mood:  Dysphoric  Affect:  Congruent  Thought Process:  Coherent  Orientation:  Full (Time, Place, and Person)  Thought Content:  Logical   Suicidal Thoughts:  No  Homicidal Thoughts:  No  Memory:  Immediate;   Fair Recent;   Fair Remote;   Fair  Judgement:  Fair  Insight:  Fair  Psychomotor Activity:  Decreased  Concentration:  Concentration: Fair  Recall:  [employer] of Knowledge:  Fair  Language:  Fair  Akathisia:  No  Handed:  Right  AIMS (if indicated):     Assets:  Desire for Improvement Housing Physical Health Resilience Social Support  ADL's:  Intact  Cognition:  WNL  Sleep:  Number of Hours: 8.45     Treatment Plan Summary: Daily contact with patient to assess and evaluate symptoms and progress in treatment, Medication management and Plan Patient with depression and OCD.  Tolerating medication.  Denies acute suicidal ideation.  Reviewed treatment plan and encourage group attendance and encouraged her to work with treatment team on outpatient planning.  Likely discharge tomorrow.  No change to medicine today.  Alethia Berthold, MD 08/08/2019, 12:52 PM

## 2019-08-08 NOTE — Plan of Care (Signed)
The patient is calm and cooperative.  Problem: Safety: Goal: Periods of time without injury will increase Outcome: Progressing   Problem: Education: Goal: Utilization of techniques to improve thought processes will improve Outcome: Progressing

## 2019-08-08 NOTE — [facility] Group Notes (Signed)
[facility] Group Notes:  (Nursing/MHT/Case Management/Adjunct)  Date:  08/08/2019  Time:  12:52 AM  Type of Therapy:  Group Therapy  Participation Level:  Did Not Attend   Landry Mellow 08/08/2019, 12:52 AM

## 2019-08-09 MED ORDER — TRAZODONE HCL 50 MG PO TABS: 50.0000 mg | ORAL_TABLET | Freq: Every day | ORAL | 1 refills | Status: DC

## 2019-08-09 MED ORDER — SERTRALINE HCL 50 MG PO TABS: 50.0000 mg | ORAL_TABLET | Freq: Every day | ORAL | 1 refills | Status: DC

## 2019-08-09 NOTE — [facility] Group Notes (Signed)
[facility] Group Notes:  (Nursing/MHT/Case Management/Adjunct)  Date:  08/09/2019  Time:  9:40 AM  Type of Therapy:  Community Meeting  Participation Level:  Active  Participation Quality:  Appropriate, Attentive and Sharing  Affect:  Appropriate  Cognitive:  Alert and Appropriate  Insight:  Appropriate  Engagement in Group:  Engaged  Modes of Intervention:  Discussion, Education and Support  Summary of Progress/Problems:  Amanda Fisher [name] 08/09/2019, 9:40 AM

## 2019-08-09 NOTE — Progress Notes (Signed)
Patient pleasant and cooperative. Denies SI/HI/AVH depression and anxiety at this time.  Patient observed on the unit engaging with peers. Received medication and tolerated without incident. Patient safe with 15 minute checks. Informed to contact staff with any questions or concerns.

## 2019-08-09 NOTE — Plan of Care (Signed)
  Problem: Education: Goal: Emotional status will improve Outcome: Progressing Goal: Mental status will improve Outcome: Progressing   Problem: Education: Goal: Knowledge of the prescribed therapeutic regimen will improve Outcome: Progressing   Problem: Coping: Goal: Coping ability will improve Outcome: Progressing Goal: Will verbalize feelings Outcome: Progressing   Problem: Safety: Goal: Ability to identify and utilize support systems that promote safety will improve Outcome: Progressing   Problem: Self-Concept: Goal: Will verbalize positive feelings about self Outcome: Progressing Goal: Level of anxiety will decrease Outcome: Progressing

## 2019-08-09 NOTE — Progress Notes (Signed)
Patient was provided with discharge summary, Transition packet, and Suicide Risk Assessment. Verbalized discharge summary, transition packet and suicide risk assessment, patient made aware of any change in medications, and all upcoming appointments.   Patient belongings/money returned. Rx provided to patient.   Patient denied any SI, denies plans for self harm or the harm of others. Patient is calm, with appropriate affect and eye contact. Patient reports no complaints at this time.   Patient will be discharged to husband

## 2019-08-09 NOTE — Plan of Care (Signed)
  Problem: Education: Goal: Knowledge of [facility] Education information/materials will improve Outcome: Adequate for Discharge Goal: Emotional status will improve Outcome: Adequate for Discharge Goal: Mental status will improve Outcome: Adequate for Discharge Goal: Verbalization of understanding the information provided will improve Outcome: Adequate for Discharge   Problem: Health Behavior/Discharge Planning: Goal: Identification of resources available to assist in meeting health care needs will improve Outcome: Adequate for Discharge Goal: Compliance with treatment plan for underlying cause of condition will improve Outcome: Adequate for Discharge   Problem: Safety: Goal: Periods of time without injury will increase Outcome: Adequate for Discharge   Problem: Education: Goal: Utilization of techniques to improve thought processes will improve Outcome: Adequate for Discharge Goal: Knowledge of the prescribed therapeutic regimen will improve Outcome: Adequate for Discharge   Problem: Coping: Goal: Coping ability will improve Outcome: Adequate for Discharge Goal: Will verbalize feelings Outcome: Adequate for Discharge   Problem: Role Relationship: Goal: Will demonstrate positive changes in social behaviors and relationships Outcome: Adequate for Discharge   Problem: Safety: Goal: Ability to disclose and discuss suicidal ideas will improve Outcome: Adequate for Discharge Goal: Ability to identify and utilize support systems that promote safety will improve Outcome: Adequate for Discharge   Problem: Self-Concept: Goal: Will verbalize positive feelings about self Outcome: Adequate for Discharge Goal: Level of anxiety will decrease Outcome: Adequate for Discharge

## 2019-08-09 NOTE — Progress Notes (Signed)
  [facility] Adult Case Management Discharge Plan :  Will you be returning to the same living situation after discharge:  Yes,  pt reports that she is returning home.  At discharge, do you have transportation home?: Yes,  pt reports husband will provide transportation.  Do you have the ability to pay for your medications: Yes,  [place] [employer].  Release of information consent forms completed and in the chart;  Patient's signature needed at discharge.  Patient to Follow up at: Follow-up Information    [facility] up on 08/18/2019.   Why: You are scheduled for an in person visit on Friday, May 14th at 11am with Dr. Berton Bon. Please bring photo ID, insurance card and hospital discharge paperwork. Thank you. Contact information: [address] [place] [address] [phone]           Next level of care provider has access to [facility] Link:no  Safety Planning and Suicide Prevention discussed: Yes,  SPE completed with the patient.   Have you used any form of tobacco in the last 30 days? (Cigarettes, Smokeless Tobacco, Cigars, and/or Pipes): No  Has patient been referred to the Quitline?: N/A patient is not a smoker  Patient has been referred for addiction treatment: N/A  Harden Mo, LCSW 08/09/2019, 9:24 AM

## 2019-08-09 NOTE — Discharge Summary (Signed)
Physician Discharge Summary Note  Patient:  Amanda Fisher is an 29 y.o., female MRN:  [id number] DOB:  07-24-1990 Patient phone:  [phone] (home)  Patient address:   [address] [place] [address],  Total Time spent with patient: 30 minutes  Date of Admission:  08/07/2019 Date of Discharge: Aug 09, 2019  Reason for Admission: Admitted because of presentation with depression and overdose  Principal Problem: Severe recurrent major depression without psychotic features [facility]) Discharge Diagnoses: Principal Problem:   Severe recurrent major depression without psychotic features ([place]) Active Problems:   OCD (obsessive compulsive disorder)   Diphenhydramine overdose   Past Psychiatric History: outpt treatmentr  Past Medical History:  Past Medical History:  Diagnosis Date  . Acid reflux   . Asthma   . Depression   . Hypoglycemia     Past Surgical History:  Procedure Laterality Date  . TONSILLECTOMY AND ADENOIDECTOMY    . WISDOM TOOTH EXTRACTION  2013   Family History:  Family History  Problem Relation Age of Onset  . Cancer Mother 20       thyroid  . Thyroid disease Mother   . Hypertension Mother   . Cancer Father 6       appendix - ca caused them to rupture  . Hypertension Father   . Cancer Paternal Grandmother 91       cervical   Family Psychiatric  History: see previous Social History:  Social History   Substance and Sexual Activity  Alcohol Use Not Currently   Comment: occasionally     Social History   Substance and Sexual Activity  Drug Use No    Social History   Socioeconomic History  . Marital status: Married    Spouse name: Not on file  . Number of children: 1  . Years of education: 4  . Highest education level: Not on file  Occupational History  . Occupation: food and minor maintenance    Employer: [employer]  Tobacco Use  . Smoking status: Never Smoker  . Smokeless tobacco: Never Used  Substance and Sexual Activity  . Alcohol use: Not  Currently    Comment: occasionally  . Drug use: No  . Sexual activity: Yes    Birth control/protection: None  Other Topics Concern  . Not on file  Social History Narrative  . Not on file   Social Determinants of Health   Financial Resource Strain:   . Difficulty of Paying Living Expenses:   Food Insecurity:   . Worried About Charity fundraiser in the Last Year:   . Arboriculturist in the Last Year:   Transportation Needs:   . Film/video editor (Medical):   Marland Kitchen Lack of Transportation (Non-Medical):   Physical Activity:   . Days of Exercise per Week:   . Minutes of Exercise per Session:   Stress:   . Feeling of Stress :   Social Connections:   . Frequency of Communication with Friends and Family:   . Frequency of Social Gatherings with Friends and Family:   . Attends Religious Services:   . Active Member of Clubs or Organizations:   . Attends Archivist Meetings:   Marland Kitchen Marital Status:     Hospital Course:  stablized on medication. Engaged in group and individual assessment. Agreews to outpt treatment. Discharged home  Physical Findings: AIMS:  , ,  ,  ,    CIWA:    COWS:     Musculoskeletal: Strength &  Muscle Tone: within normal limits Gait & Station: normal Patient leans: N/A  Psychiatric Specialty Exam: Physical Exam  Nursing note and vitals reviewed. Constitutional: She appears well-developed and well-nourished.  HENT:  Head: Normocephalic and atraumatic.  Eyes: Pupils are equal, round, and reactive to light. Conjunctivae are normal.  Cardiovascular: Regular rhythm and normal heart sounds.  Respiratory: Effort normal. No respiratory distress.  GI: Soft.  Musculoskeletal:        General: Normal range of motion.     Cervical back: Normal range of motion.  Neurological: She is alert.  Skin: Skin is warm and dry.  Psychiatric: She has a normal mood and affect. Her behavior is normal. Judgment and thought content normal.    Review of Systems   Constitutional: Negative.   HENT: Negative.   Eyes: Negative.   Respiratory: Negative.   Cardiovascular: Negative.   Gastrointestinal: Negative.   Musculoskeletal: Negative.   Skin: Negative.   Neurological: Negative.   Psychiatric/Behavioral: Negative.     Blood pressure 110/73, pulse (!) 107, temperature 98.4 F (36.9 C), temperature source Oral, resp. rate 18, height 5\' 3"  (1.6 m), weight 69.4 kg, SpO2 98 %, currently breastfeeding.Body mass index is 27.1 kg/m.  General Appearance: Casual  Eye Contact:  Good  Speech:  Clear and Coherent  Volume:  Normal  Mood:  Euthymic  Affect:  Congruent  Thought Process:  Goal Directed  Orientation:  Full (Time, Place, and Person)  Thought Content:  Logical  Suicidal Thoughts:  No  Homicidal Thoughts:  No  Memory:  Immediate;   Fair Recent;   Fair Remote;   Fair  Judgement:  Fair  Insight:  Fair  Psychomotor Activity:  Decreased  Concentration:  Concentration: Fair  Recall:  of Knowledge:  Fair  Language:  Fair  Akathisia:  No  Handed:  Right  AIMS (if indicated):     Assets:  Desire for Improvement  ADL's:  Intact  Cognition:  WNL  Sleep:  Number of Hours: 7.5     Have you used any form of tobacco in the last 30 days? (Cigarettes, Smokeless Tobacco, Cigars, and/or Pipes): No  Has this patient used any form of tobacco in the last 30 days? (Cigarettes, Smokeless Tobacco, Cigars, and/or Pipes) Yes, No  Blood Alcohol level:  Lab Results  Component Value Date   ETH <10 08/06/2019    Metabolic Disorder Labs:  No results found for: HGBA1C, MPG No results found for: PROLACTIN No results found for: CHOL, TRIG, HDL, CHOLHDL, VLDL, LDLCALC  See Psychiatric Specialty Exam and Suicide Risk Assessment completed by Attending Physician prior to discharge.  Discharge destination:  Home  Is patient on multiple antipsychotic therapies at discharge:  No   Has Patient had three or more failed trials of antipsychotic  monotherapy by history:  No  Recommended Plan for Multiple Antipsychotic Therapies: NA  Discharge Instructions    Diet - low sodium heart healthy   Complete by: As directed    Increase activity slowly   Complete by: As directed      Allergies as of 08/09/2019      Reactions   Augmentin [amoxicillin-pot Clavulanate]    Other    SEASONAL   Promethazine Other (See Comments)   Shaking, restless legs   Tamiflu [oseltamivir] Other (See Comments)   depression   Fluoxetine Rash      Medication List    STOP taking these medications   multivitamin-prenatal 27-0.8 MG Tabs tablet   neomycin-polymyxin-hydrocortisone  3.5-10000-1 OTIC suspension Commonly known as: CORTISPORIN     TAKE these medications     Indication  sertraline 50 MG tablet Commonly known as: ZOLOFT Take 1 tablet (50 mg total) by mouth daily. Start taking on: Aug 10, 2019 What changed: how much to take  Indication: Major Depressive Disorder, Obsessive Compulsive Disorder   traZODone 50 MG tablet Commonly known as: DESYREL Take 1 tablet (50 mg total) by mouth at bedtime.  Indication: Trouble Sleeping      Follow-up Information    [facility] Follow up on 08/18/2019.   Why: You are scheduled for an in person visit on Friday, May 14th at 11am with Dr. Berton Bon. Please bring photo ID, insurance card and hospital discharge paperwork. Thank you. Contact information: [address] [place] [address] [phone]           Follow-up recommendations:  Activity:  as tolarated Diet:  regualar Other:  follow u[p therapy and medication  Comments:  *scriprts at discharge  Signed: Mordecai Rasmussen, MD 08/09/2019, 9:24 AM

## 2019-08-09 NOTE — [facility] Suicide Risk Assessment (Signed)
[facility] Admission Suicide Risk Assessment   Nursing information obtained from:  Patient Demographic factors:  Caucasian, Unemployed, Adolescent or young adult Current Mental Status:  Self-harm thoughts Loss Factors:  NA Historical Factors:  Impulsivity Risk Reduction Factors:  Responsible for children under 27 years of age, Living with another person, especially a relative  Total Time spent with patient: 30 minutes Principal Problem: Severe recurrent major depression without psychotic features (HCC) Diagnosis:  Principal Problem:   Severe recurrent major depression without psychotic features (HCC) Active Problems:   OCD (obsessive compulsive disorder)   Diphenhydramine overdose  Subjective Data: Patient seen.  Treatment plan reviewed.  Patient is tolerating medicine well.  Denies any acute suicidal ideation and feels capable of recognizing suicidal thoughts and getting help if needed.  She is fully agreeable to appropriate outpatient treatment.  Continued Clinical Symptoms:  Alcohol Use Disorder Identification Test Final Score (AUDIT): 1 The "Alcohol Use Disorders Identification Test", Guidelines for Use in Primary Care, Second Edition.  World Science writer [facility]). Score between 0-7:  no or low risk or alcohol related problems. Score between 8-15:  moderate risk of alcohol related problems. Score between 16-19:  high risk of alcohol related problems. Score 20 or above:  warrants further diagnostic evaluation for alcohol dependence and treatment.   CLINICAL FACTORS:   Severe Anxiety and/or Agitation Depression:   Anhedonia Obsessive-Compulsive Disorder   Musculoskeletal: Strength & Muscle Tone: within normal limits Gait & Station: normal Patient leans: N/A  Psychiatric Specialty Exam: Physical Exam  Nursing note and vitals reviewed. Constitutional: She appears well-developed and well-nourished.  HENT:  Head: Normocephalic and atraumatic.  Eyes: Pupils are equal, round,  and reactive to light. Conjunctivae are normal.  Cardiovascular: Regular rhythm and normal heart sounds.  Respiratory: Effort normal. No respiratory distress.  GI: Soft.  Musculoskeletal:        General: Normal range of motion.     Cervical back: Normal range of motion.  Neurological: She is alert.  Skin: Skin is warm and dry.  Psychiatric: Judgment normal. Her mood appears anxious. Her speech is delayed. She is slowed. Thought content is not paranoid. Cognition and memory are normal. She expresses no homicidal and no suicidal ideation.    Review of Systems  Constitutional: Negative.   HENT: Negative.   Eyes: Negative.   Respiratory: Negative.   Cardiovascular: Negative.   Gastrointestinal: Negative.   Musculoskeletal: Negative.   Skin: Negative.   Neurological: Negative.   Psychiatric/Behavioral: Positive for dysphoric mood. Negative for suicidal ideas.    Blood pressure 110/73, pulse (!) 107, temperature 98.4 F (36.9 C), temperature source Oral, resp. rate 18, height 5\' 3"  (1.6 m), weight 69.4 kg, SpO2 98 %, currently breastfeeding.Body mass index is 27.1 kg/m.  General Appearance: Casual  Eye Contact:  Good  Speech:  Clear and Coherent  Volume:  Normal  Mood:  Euthymic  Affect:  Constricted  Thought Process:  Coherent  Orientation:  Full (Time, Place, and Person)  Thought Content:  Logical  Suicidal Thoughts:  No  Homicidal Thoughts:  No  Memory:  Immediate;   Fair Recent;   Fair Remote;   Fair  Judgement:  Fair  Insight:  Fair  Psychomotor Activity:  Normal  Concentration:  Concentration: Fair  Recall:  of Knowledge:  Fair  Language:  Fair  Akathisia:  No  Handed:  Right  AIMS (if indicated):     Assets:  Desire for Improvement Housing Physical Health Resilience Social Support  ADL's:  Intact  Cognition:  WNL  Sleep:  Number of Hours: 7.5      COGNITIVE FEATURES THAT CONTRIBUTE TO RISK:  None    SUICIDE RISK:   Minimal: No identifiable  suicidal ideation.  Patients presenting with no risk factors but with morbid ruminations; may be classified as minimal risk based on the severity of the depressive symptoms  PLAN OF CARE: Patient will be discharged with prescriptions for current medicines.  She will be referred for outpatient therapy and medication management.  She is agreeable to plan.  I certify that inpatient services furnished can reasonably be expected to improve the patient's condition.   Alethia Berthold, MD 08/09/2019, 9:20 AM

## 2019-09-23 ENCOUNTER — Ambulatory Visit
Admission: EM | Admit: 2019-09-23 | Discharge: 2019-09-23 | Disposition: A | Discharge status: Home / Self Care | Payer: Medicaid Other | Attending: Internal Medicine | Admitting: Internal Medicine

## 2019-09-23 ENCOUNTER — Other Ambulatory Visit: Payer: Self-pay

## 2019-09-23 DIAGNOSIS — K047 Periapical abscess without sinus: Secondary | ICD-10-CM | POA: Diagnosis not present

## 2019-09-23 MED ORDER — IBUPROFEN 600 MG PO TABS
600.0000 mg | ORAL_TABLET | Freq: Four times a day (QID) | ORAL | 0 refills | Status: DC | PRN
Start: 2019-09-23 — End: 2020-08-23

## 2019-09-23 MED ORDER — CLINDAMYCIN HCL 300 MG PO CAPS: 300.0000 mg | ORAL_CAPSULE | Freq: Three times a day (TID) | ORAL | 0 refills | Status: AC

## 2019-09-23 MED ORDER — DESLORATADINE 5 MG PO TABS: 5.0000 mg | ORAL_TABLET | Freq: Every day | ORAL | 1 refills | Status: DC

## 2019-09-23 NOTE — ED Triage Notes (Signed)
Patient complains of possible dental pain/ abscess that started on Thursday evening. States that she has noticed lymphnode swelling today. Reports that tooth is on the bottom right.

## 2019-09-23 NOTE — ED Provider Notes (Signed)
[facility]    CSN: [id number] Arrival date & time: 09/23/19  1322      History   Chief Complaint Chief Complaint  Patient presents with  . Dental Pain    HPI Amanda Fisher is a 29 y.o. female comes to the urgent care with right jaw pain which started 3 days ago.  Patient states onset was insidious and is gotten progressively worse.  Is worse by palpation.  Patient endorses sensitivity with cold or warm fluids around the right mandibular teeth.  No nausea, vomiting or diarrhea.  No fever or chills.Marland Kitchen   HPI  Past Medical History:  Diagnosis Date  . Acid reflux   . Asthma   . Depression   . Hypoglycemia     Patient Active Problem List   Diagnosis Date Noted  . Suicide attempt by drug ingestion (HCC) 08/07/2019  . 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 OCD (obsessive compulsive disorder) 08/07/2019  . Diphenhydramine overdose 08/07/2019  . Asthma 04/09/2019  . Vaginal bleeding in pregnancy, third trimester 08/06/2018  . Supervision of normal first pregnancy, antepartum 04/21/2017    Past Surgical History:  Procedure Laterality Date  . TONSILLECTOMY AND ADENOIDECTOMY    . WISDOM TOOTH EXTRACTION  2013    OB History    Gravida  2   Para  1   Term  1   Preterm      AB  1   Living  1     SAB  1   TAB      Ectopic      Multiple      Live Births  1            Home Medications    Prior to Admission medications   Medication Sig Start Date End Date Taking? Authorizing Provider  montelukast (SINGULAIR) 10 MG tablet Take 10 mg by mouth daily. 08/11/19  Yes [provider]  sertraline (ZOLOFT) 50 MG tablet Take 1 tablet (50 mg total) by mouth daily. 08/10/19  Yes Clapacs, Jackquline Denmark, MD  traZODone (DESYREL) 50 MG tablet Take 1 tablet (50 mg total) by mouth at bedtime. 08/09/19  Yes Clapacs, Jackquline Denmark, MD  clindamycin (CLEOCIN) 300 MG capsule Take 1 capsule (300 mg total) by mouth 3 (three) times daily  for 7 days. 09/23/19 09/30/19  Merrilee Jansky, MD  desloratadine (CLARINEX) 5 MG tablet Take 1 tablet (5 mg total) by mouth daily. 09/23/19   Daisa Stennis, Britta Mccreedy, MD  ibuprofen (ADVIL) 600 MG tablet Take 1 tablet (600 mg total) by mouth every 6 (six) hours as needed. 09/23/19   Cleotis Sparr, Britta Mccreedy, MD    Family History Family History  Problem Relation Age of Onset  . Cancer Mother 20       thyroid  . Thyroid disease Mother   . Hypertension Mother   . Cancer Father 45       appendix - ca caused them to rupture  . Hypertension Father   . Cancer Paternal Grandmother 97       cervical    Social History Social History   Tobacco Use  . Smoking status: Never Smoker  . Smokeless tobacco: Never Used  Vaping Use  . Vaping Use: Former  Substance Use Topics  . Alcohol use: Yes    Comment: occasionally  . Drug use: No     Allergies   Augmentin [amoxicillin-pot clavulanate], Other, Promethazine, Tamiflu [oseltamivir], and Fluoxetine   Review of  Systems Review of Systems  HENT: Positive for dental problem and ear pain. Negative for hearing loss, sinus pressure, sinus pain, tinnitus and trouble swallowing.   Respiratory: Negative for cough, chest tightness and shortness of breath.   Gastrointestinal: Positive for abdominal pain.     Physical Exam Triage Vital Signs ED Triage Vitals  Enc Vitals Group     BP 09/23/19 1345 118/68     Pulse Rate 09/23/19 1345 66     Resp 09/23/19 1345 18     Temp 09/23/19 1345 98.3 F (36.8 C)     Temp Source 09/23/19 1345 Oral     SpO2 09/23/19 1345 100 %     Weight 09/23/19 1343 160 lb (72.6 kg)     Height 09/23/19 1343 5\' 3"  (1.6 m)     Head Circumference --      Peak Flow --      Pain Score 09/23/19 1342 9     Pain Loc --      Pain Edu? --      Excl. in GC? --    No data found.  Updated Vital Signs BP 118/68 (BP Location: Left Arm)   Pulse 66   Temp 98.3 F (36.8 C) (Oral)   Resp 18   Ht 5\' 3"  (1.6 m)   Wt 72.6 kg   LMP  08/21/2019   SpO2 100%   Breastfeeding Yes   BMI 28.34 kg/m   Visual Acuity Right Eye Distance:   Left Eye Distance:   Bilateral Distance:    Right Eye Near:   Left Eye Near:    Bilateral Near:     Physical Exam Vitals and nursing note reviewed.  Constitutional:      General: She is not in acute distress.    Appearance: She is not ill-appearing.  HENT:     Right Ear: Tympanic membrane normal.     Left Ear: Tympanic membrane normal.     Nose: Nose normal. No congestion or rhinorrhea.     Mouth/Throat:     Mouth: Mucous membranes are moist.     Pharynx: No oropharyngeal exudate or posterior oropharyngeal erythema.     Comments: Dental hygiene is fair. Eyes:     Conjunctiva/sclera: Conjunctivae normal.  Cardiovascular:     Rate and Rhythm: Normal rate and regular rhythm.  Neurological:     Mental Status: She is alert.      UC Treatments / Results  Labs (all labs ordered are listed, but only abnormal results are displayed) Labs Reviewed - No data to display  EKG   Radiology No results found.  Procedures Procedures (including critical care time)  Medications Ordered in UC Medications - No data to display  Initial Impression / Assessment and Plan / UC Course  I have reviewed the triage vital signs and the nursing notes.  Pertinent labs & imaging results that were available during my care of the patient were reviewed by me and considered in my medical decision making (see chart for details).     1.  Dental infection: Patient is allergic to Augmentin Clindamycin 300 mg 3 times daily for 7 days Ibuprofen 600 mg every 6 hours as needed for pain Patient is advised to follow-up with dentist in the office Return precautions given. Final Clinical Impressions(s) / UC Diagnoses   Final diagnoses:  Dental abscess   Discharge Instructions   None    ED Prescriptions    Medication Sig Dispense Auth. Provider   clindamycin (CLEOCIN)  300 MG capsule Take 1  capsule (300 mg total) by mouth 3 (three) times daily for 7 days. 21 capsule Lasondra Hodgkins, Myrene Galas, MD   desloratadine (CLARINEX) 5 MG tablet Take 1 tablet (5 mg total) by mouth daily. 30 tablet Eavan Gonterman, Myrene Galas, MD   ibuprofen (ADVIL) 600 MG tablet Take 1 tablet (600 mg total) by mouth every 6 (six) hours as needed. 30 tablet Raymon Schlarb, Myrene Galas, MD     PDMP not reviewed this encounter.   Chase Picket, MD 09/23/19 [phone]

## 2019-12-02 ENCOUNTER — Other Ambulatory Visit: Payer: Self-pay

## 2019-12-02 ENCOUNTER — Emergency Department
Admission: EM | Admit: 2019-12-02 | Discharge: 2019-12-02 | Disposition: A | Discharge status: Home / Self Care | Payer: Medicaid Other | Attending: Emergency Medicine | Admitting: Emergency Medicine

## 2019-12-02 DIAGNOSIS — F419 Anxiety disorder, unspecified: Secondary | ICD-10-CM

## 2019-12-02 DIAGNOSIS — J45909 Unspecified asthma, uncomplicated: Secondary | ICD-10-CM | POA: Insufficient documentation

## 2019-12-02 DIAGNOSIS — R519 Headache, unspecified: Secondary | ICD-10-CM | POA: Insufficient documentation

## 2019-12-02 DIAGNOSIS — F19959 Other psychoactive substance use, unspecified with psychoactive substance-induced psychotic disorder, unspecified: Secondary | ICD-10-CM

## 2019-12-02 LAB — URINALYSIS, COMPLETE (UACMP) WITH MICROSCOPIC
Bacteria, UA: NONE SEEN
Bilirubin Urine: NEGATIVE
Glucose, UA: NEGATIVE mg/dL
Hgb urine dipstick: NEGATIVE
Ketones, ur: NEGATIVE mg/dL
Nitrite: NEGATIVE
Protein, ur: NEGATIVE mg/dL
Specific Gravity, Urine: 1.017 (ref 1.005–1.030)
pH: 7 (ref 5.0–8.0)

## 2019-12-02 LAB — CBC
HCT: 41.9 % (ref 36.0–46.0)
Hemoglobin: 13.4 g/dL (ref 12.0–15.0)
MCH: 26.9 pg (ref 26.0–34.0)
MCHC: 32 g/dL (ref 30.0–36.0)
MCV: 84.1 fL (ref 80.0–100.0)
Platelets: 172 10*3/uL (ref 150–400)
RBC: 4.98 MIL/uL (ref 3.87–5.11)
RDW: 12.4 % (ref 11.5–15.5)
WBC: 5.8 10*3/uL (ref 4.0–10.5)
nRBC: 0 % (ref 0.0–0.2)

## 2019-12-02 LAB — BASIC METABOLIC PANEL
Anion gap: 10 (ref 5–15)
BUN: 10 mg/dL (ref 6–20)
CO2: 24 mmol/L (ref 22–32)
Calcium: 8.9 mg/dL (ref 8.9–10.3)
Chloride: 102 mmol/L (ref 98–111)
Creatinine, Ser: 0.58 mg/dL (ref 0.44–1.00)
GFR calc Af Amer: >60 mL/min (ref >60)
GFR calc non Af Amer: >60 mL/min (ref >60)
Glucose, Bld: 114 mg/dL — ABNORMAL HIGH (ref 70–99)
Potassium: 3.3 mmol/L — ABNORMAL LOW (ref 3.5–5.1)
Sodium: 136 mmol/L (ref 135–145)

## 2019-12-02 LAB — POCT PREGNANCY, URINE: Preg Test, Ur: NEGATIVE

## 2019-12-02 MED ORDER — LORAZEPAM 1 MG PO TABS
1.0000 mg | ORAL_TABLET | Freq: Once | ORAL | Status: AC
  Administered 2019-12-02: 1 mg via ORAL
  Filled 2019-12-02: qty 1

## 2019-12-02 NOTE — ED Triage Notes (Signed)
Pt arrived via POV with reports of head pressure and feeling anxious, pt states she took a dose of THC gummy Thursday night around 6pm to help with sleep.  Pt states she has been having COVID-like sxs for about 2 weeks and husband tested positive last Saturday, pt had negative test.  Pt states she feels like her head is full and that she is floating upside down.  Pt is alert and oriented at this time.  Pt takes zoloft for depression and anxiety.

## 2019-12-02 NOTE — ED Notes (Signed)
Assisted pt to bathroom with steady gait. Pt placed back on CCM with call bell within reach. Pt with no other needs.

## 2019-12-02 NOTE — Discharge Instructions (Addendum)
Continue take your normal medications.  Avoid THC or any other substances.  Follow-up with your psychiatrist this week.  Return to the ER immediately for new, worsening, or persistent severe anxiety, worsening feelings of depression, or any thoughts of wanting to hurt yourself or anyone else.

## 2019-12-02 NOTE — ED Notes (Signed)
Pt in room with visitor at bedside. States has been experiencing increased anxiety and disturbed sleep for 48hr since she ingested "cbd gummies" that "contained thc". Pt takes zoloft. States lives at home with husband who was covid positive, per triage note pt with symptoms x 2 wks, no symptoms mentioned during assmt by this RN.

## 2019-12-02 NOTE — ED Provider Notes (Signed)
[facility] Emergency Department Provider Note ____________________________________________   First MD Initiated Contact with Patient 12/02/19 1821     (approximate)  I have reviewed the triage vital signs and the nursing notes.   HISTORY  Chief Complaint Headache, Anxiety, and Ingestion (THC Gummy on Thursday)    HPI Amanda Fisher is a 29 y.o. female with PMH as noted below who presents with multiple complaints. Her primary complaint is a group of physical symptoms which she has experienced over the last 2 days ever since she ingested a single THC gummy candy around 6 PM Thursday. She reports pressure in her head, a feeling as if her body is "not real," lightheadedness, and a sensation of floating. She states that she had Covid-like symptoms about 2 weeks ago, and her husband tested positive for COVID-19. She states that at that time she lost her sense of taste and smell, had subjective fever and chills and malaise. The symptoms have subsequently resolved. She had a recent test which was negative.  The patient also reports mental health symptoms which have worsened over the last few weeks, but then acutely became worse in the last 2 days since taking the THC gummy. Specifically she reports increased anxiety and paranoia. She reports chronic feelings of wanting to die, but denies any intention for self-harm or any acute suicidal ideation or plan. She states that she is here to get help. She denies any other illicit substance use, and reports that she has been compliant with her medications.  Past Medical History:  Diagnosis Date  . Acid reflux   . Asthma   . Depression   . Hypoglycemia     Patient Active Problem List   Diagnosis Date Noted  . 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 Suicide attempt by drug ingestion (HCC) 08/07/2019  . 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 OCD (obsessive compulsive disorder) 08/07/2019    . Diphenhydramine overdose 08/07/2019  . Asthma 04/09/2019  . Vaginal bleeding in pregnancy, third trimester 08/06/2018  . Supervision of normal first pregnancy, antepartum 04/21/2017    Past Surgical History:  Procedure Laterality Date  . TONSILLECTOMY AND ADENOIDECTOMY    . WISDOM TOOTH EXTRACTION  2013    Prior to Admission medications   Medication Sig Start Date End Date Taking? Authorizing Provider  desloratadine (CLARINEX) 5 MG tablet Take 1 tablet (5 mg total) by mouth daily. 09/23/19   Lamptey, Britta Mccreedy, MD  ibuprofen (ADVIL) 600 MG tablet Take 1 tablet (600 mg total) by mouth every 6 (six) hours as needed. 09/23/19   Lamptey, Britta Mccreedy, MD  montelukast (SINGULAIR) 10 MG tablet Take 10 mg by mouth daily. 08/11/19   [provider]  sertraline (ZOLOFT) 50 MG tablet Take 1 tablet (50 mg total) by mouth daily. 08/10/19   Clapacs, Jackquline Denmark, MD  traZODone (DESYREL) 50 MG tablet Take 1 tablet (50 mg total) by mouth at bedtime. 08/09/19   Clapacs, Jackquline Denmark, MD    Allergies Amoxicillin-pot clavulanate, Promethazine, Other, Tamiflu [oseltamivir], and Fluoxetine  Family History  Problem Relation Age of Onset  . Cancer Mother 20       thyroid  . Thyroid disease Mother   . Hypertension Mother   . Cancer Father 39       appendix - ca caused them to rupture  . Hypertension Father   . Cancer Paternal Grandmother 26       cervical    Social History Social History  Tobacco Use  . Smoking status: Never Smoker  . Smokeless tobacco: Never Used  Vaping Use  . Vaping Use: Former  Substance Use Topics  . Alcohol use: Yes    Comment: occasionally  . Drug use: No    Review of Systems  Constitutional: No fever/chills. Eyes: No visual changes. ENT: No sore throat. Cardiovascular: Denies chest pain. Respiratory: Denies shortness of breath. Gastrointestinal: No vomiting or diarrhea.  Genitourinary: Negative for dysuria.  Musculoskeletal: Negative for back pain. Skin: Negative  for rash. Neurological: Negative for headache.   ____________________________________________   PHYSICAL EXAM:  VITAL SIGNS: ED Triage Vitals  Enc Vitals Group     BP 12/02/19 1323 (!) 118/95     Pulse Rate 12/02/19 1323 79     Resp 12/02/19 1323 16     Temp 12/02/19 1323 99.2 F (37.3 C)     Temp Source 12/02/19 1323 Oral     SpO2 12/02/19 1323 100 %     Weight 12/02/19 1323 158 lb (71.7 kg)     Height 12/02/19 1323 5\' 3"  (1.6 m)     Head Circumference --      Peak Flow --      Pain Score 12/02/19 1340 0     Pain Loc --      Pain Edu? --      Excl. in GC? --     Constitutional: Alert and oriented. Well appearing and in no acute distress. Eyes: Conjunctivae are normal. EOMI. PERRLA. Head: Atraumatic. Nose: No congestion/rhinnorhea. Mouth/Throat: Mucous membranes are moist.   Neck: Normal range of motion.  Cardiovascular: Normal rate, regular rhythm.   Good peripheral circulation. Respiratory: Normal respiratory effort.  No retractions. Gastrointestinal: No distention.  Musculoskeletal: Extremities warm and well perfused.  Neurologic:  Normal speech and language. No gross focal neurologic deficits are appreciated.  Skin:  Skin is warm and dry. No rash noted. Psychiatric: Calm and cooperative.  ____________________________________________   LABS (all labs ordered are listed, but only abnormal results are displayed)  Labs Reviewed  BASIC METABOLIC PANEL - Abnormal; Notable for the following components:      Result Value   Potassium 3.3 (*)    Glucose, Bld 114 (*)    All other components within normal limits  URINALYSIS, COMPLETE (UACMP) WITH MICROSCOPIC - Abnormal; Notable for the following components:   Color, Urine YELLOW (*)    APPearance HAZY (*)    Leukocytes,Ua TRACE (*)    All other components within normal limits  CBC  POC URINE PREG, ED  POCT PREGNANCY, URINE   ____________________________________________  EKG  ED ECG REPORT I, 12/04/19, the attending physician, personally viewed and interpreted this ECG.  Date: 12/02/2019 EKG Time: 1343 Rate: 72 Rhythm: normal sinus rhythm QRS Axis: normal Intervals: normal ST/T Wave abnormalities: Nonspecific T wave flattening Narrative Interpretation: Nonspecific abnormalities with no evidence of acute ischemia; no significant change when compared to EKG of 5-21  ____________________________________________  RADIOLOGY    ____________________________________________   PROCEDURES  Procedure(s) performed: No  Procedures  Critical Care performed: No ____________________________________________   INITIAL IMPRESSION / ASSESSMENT AND PLAN / ED COURSE  Pertinent labs & imaging results that were available during my care of the patient were reviewed by me and considered in my medical decision making (see chart for details).  29 year old female with PMH as noted above presents with nonspecific physical symptoms over the last 2 days since she took a THC gummy, as well as increased anxiety and paranoia  as well as increased depressive feelings over the last few weeks. The patient denies acute SI or HI.  I reviewed the past medical records in Epic. The patient was evaluated in May of this year with increased depression and an attempt at self-harm by ingesting ibuprofen. She was admitted to psychiatry at that time.  On exam today, the patient is anxious but comfortable appearing. Her vital signs are normal. The physical exam is unremarkable including thorough neurologic exam.  Initial lab work-up obtained from triage is within normal limits. Overall I suspect that the physical symptoms are side effects of the THC gummy, likely exacerbated by anxiety. The mental health symptoms are also possibly exacerbated by the [facility]. At this time the patient does not demonstrate acute danger to self or others and does not meet IVC criteria, however I discussed psychiatric evaluation with her  and she agrees with this plan.  We will obtain psychiatry and TTS consults and reassess.  ----------------------------------------- 11:09 PM on 12/02/2019 -----------------------------------------  Patient was seen by psych NP Durwin Nora and is cleared from a psychiatric perspective.  IVC is not recommended.  They had offered the patient to be observed overnight to wait until the effects of the [facility] wore off more fully, however the patient states that she would like to go home.  On my reassessment, she continues to appear comfortable.  She continues to deny SI and HI and feels safe to go home, and is accompanied by a friend.  At this time there is no evidence of danger to self or others.  I counseled her on the results of the work-up.  I instructed her to follow-up with her psychiatrist this week and I gave her through her return precautions.  She expressed understanding.  __________________________  The patient has been placed in psychiatric observation due to the need to provide a safe environment for the patient while obtaining psychiatric consultation and evaluation, as well as ongoing medical and medication management to treat the patient's condition.  The patient has not been placed under full IVC at this time.  ____________________________________________   FINAL CLINICAL IMPRESSION(S) / ED DIAGNOSES  Final diagnoses:  Anxiety      NEW MEDICATIONS STARTED DURING THIS VISIT:  New Prescriptions   No medications on file     Note:  This document was prepared using Dragon voice recognition software and may include unintentional dictation errors.   Dionne Bucy, MD 12/02/19 2312

## 2019-12-02 NOTE — ED Notes (Signed)
Pt family member in room and aware of situation.

## 2019-12-02 NOTE — ED Notes (Signed)
Pt alert and oriented times 4.

## 2019-12-02 NOTE — ED Notes (Signed)
Pt requesting to leave at this time. MD made aware. 

## 2019-12-02 NOTE — Consult Note (Signed)
[facility] Face-to-Face Psychiatry Consult   Reason for Consult:  Psych Evaluation  Referring Physician:  Dr. Marisa Severin Patient Identification: Amanda Fisher MRN:  [id number] Principal Diagnosis: Drug psychosis (HCC) Diagnosis:  Principal Problem:   Drug psychosis (HCC)   Total Time spent with patient: 45 minutes  Subjective:   Amanda Fisher is a 29 y.o. female patient admitted with anxiety and paranoia after ingesting 100 mg of THC via a gummy for sleep.   HPI:  [name] is an 29 y.o. female. Pt presented to the ED voluntarily. Per triage note Pt arrived via POV with reports of head pressure and feeling anxious, pt. states she took a dose of THC gummy Thursday night around 6pm to help with sleep. Pt states she has been having COVID-like sxs for about 2 weeks and husband tested positive last Saturday, pt. had negative test. Pt states she feels like her head is full and that she is floating upside down. Pt takes Zoloft for depression and anxiety.  Per TTS note Patient was calm and cooperative upon interview. The patient presented with relevant and coherent speech and thought When asked what had brought her to the hospital the patient stated, "My anxiety and depression came back in my dreams." Patient states that she was prescribed Zoloft by her PCP, which was working well until recently. The patient expressed that she and her husband had went on a trip with their church and they were exposed to Covid which was stressful. Patient reported no previous psychiatric admissions. Pt explained that she does not have a psychiatrist or a therapist.  The patient reported an increase of bizarre or strange dreams lately.The patient expressed that as a result of the dreams she is experiencing worsening depression and anxiety symptoms. The patient admitted to trying a gummy infused with THC that her friend provided to her to try to get better sleep. Upon taking the gummy, the patient reported symptoms of panic and  paranoia. Pt currently denies any hallucinations, suicidal/homicidal ideations, or paranoia at this time.   Patient is psych cleared as she states that zoloft has bee effective in managing her depression symptoms.  Patient states she will follow-up with PCP in regards to nightmares that have been occurring in the last two weeks.    Past Psychiatric History: Anxiety and MDD  Risk to Self: Suicidal Ideation: No Suicidal Intent: No Is patient at risk for suicide?: No Suicidal Plan?: No Access to Means: No What has been your use of drugs/alcohol within the last 12 months?: THC How many times?: 0 Other Self Harm Risks: None noted Triggers for Past Attempts: None known Intentional Self Injurious Behavior: None Risk to Others: Homicidal Ideation: No Thoughts of Harm to Others: No Current Homicidal Intent: No Current Homicidal Plan: No Access to Homicidal Means: No Identified Victim: n/a History of harm to others?: No Assessment of Violence: None Noted Violent Behavior Description: n/a Does patient have access to weapons?: No Criminal Charges Pending?: No Does patient have a court date: No Prior Inpatient Therapy: Prior Inpatient Therapy: No Prior Outpatient Therapy: Prior Outpatient Therapy: No Does patient have an ACCT team?: No Does patient have Intensive In-House Services?  : No Does patient have [facility] services? : No Does patient have P4CC services?: No  Past Medical History:  Past Medical History:  Diagnosis Date  . Acid reflux   . Asthma   . Depression   . Hypoglycemia     Past Surgical History:  Procedure Laterality Date  . TONSILLECTOMY AND  ADENOIDECTOMY    . WISDOM TOOTH EXTRACTION  2013   Family History:  Family History  Problem Relation Age of Onset  . Cancer Mother 20       thyroid  . Thyroid disease Mother   . Hypertension Mother   . Cancer Father 35       appendix - ca caused them to rupture  . Hypertension Father   . Cancer Paternal Grandmother 57        cervical   Family Psychiatric  History: unknown Social History:  Social History   Substance and Sexual Activity  Alcohol Use Yes   Comment: occasionally     Social History   Substance and Sexual Activity  Drug Use No    Social History   Socioeconomic History  . Marital status: Married    Spouse name: Not on file  . Number of children: 1  . Years of education: 46  . Highest education level: Not on file  Occupational History  . Occupation: food and minor maintenance    Employer: [employer]  Tobacco Use  . Smoking status: Never Smoker  . Smokeless tobacco: Never Used  Vaping Use  . Vaping Use: Former  Substance and Sexual Activity  . Alcohol use: Yes    Comment: occasionally  . Drug use: No  . Sexual activity: Yes    Birth control/protection: None  Other Topics Concern  . Not on file  Social History Narrative  . Not on file   Social Determinants of Health   Financial Resource Strain:   . Difficulty of Paying Living Expenses: Not on file  Food Insecurity:   . Worried About Programme researcher, broadcasting/film/video in the Last Year: Not on file  . Ran Out of Food in the Last Year: Not on file  Transportation Needs:   . Lack of Transportation (Medical): Not on file  . Lack of Transportation (Non-Medical): Not on file  Physical Activity:   . Days of Exercise per Week: Not on file  . Minutes of Exercise per Session: Not on file  Stress:   . Feeling of Stress : Not on file  Social Connections:   . Frequency of Communication with Friends and Family: Not on file  . Frequency of Social Gatherings with Friends and Family: Not on file  . Attends Religious Services: Not on file  . Active Member of Clubs or Organizations: Not on file  . Attends Banker Meetings: Not on file  . Marital Status: Not on file   Additional Social History:    Allergies:   Allergies  Allergen Reactions  . Amoxicillin-Pot Clavulanate Anaphylaxis  . Promethazine Other (See Comments)    Shaking,  restless legs Other reaction(s): Other (See Comments) Twitchy limbs  Twitchy limbs  Shaking, restless legs Shaking, restless legs  . Other     SEASONAL  . Tamiflu [Oseltamivir] Other (See Comments)    depression  . Fluoxetine Rash    Labs:  Results for orders placed or performed during the hospital encounter of 12/02/19 (from the past 48 hour(s))  Basic metabolic panel     Status: Abnormal   Collection Time: 12/02/19  1:52 PM  Result Value Ref Range   Sodium 136 135 - 145 mmol/L   Potassium 3.3 (L) 3.5 - 5.1 mmol/L   Chloride 102 98 - 111 mmol/L   CO2 24 22 - 32 mmol/L   Glucose, Bld 114 (H) 70 - 99 mg/dL    Comment: Glucose reference range applies  only to samples taken after fasting for at least 8 hours.   BUN 10 6 - 20 mg/dL   Creatinine, Ser 1.610.58 0.44 - 1.00 mg/dL   Calcium 8.9 8.9 - 09.610.3 mg/dL   GFR calc non Af Amer >60 >60 mL/min   GFR calc Af Amer >60 >60 mL/min   Anion gap 10 5 - 15    Comment: Performed at [facility], [address] [place], [place] [address]  CBC     Status: None   Collection Time: 12/02/19  1:52 PM  Result Value Ref Range   WBC 5.8 4.0 - 10.5 K/uL   RBC 4.98 3.87 - 5.11 MIL/uL   Hemoglobin 13.4 12.0 - 15.0 g/dL   HCT 98.141.9 36 - 46 %   MCV 84.1 80.0 - 100.0 fL   MCH 26.9 26.0 - 34.0 pg   MCHC 32.0 30.0 - 36.0 g/dL   RDW 19.112.4 47.811.5 - 29.515.5 %   Platelets 172 150 - 400 K/uL   nRBC 0.0 0.0 - 0.2 %    Comment: Performed at [facility], [address] [place], [place] [address]  Urinalysis, Complete w Microscopic Urine, Clean Catch     Status: Abnormal   Collection Time: 12/02/19  1:52 PM  Result Value Ref Range   Color, Urine YELLOW (A) YELLOW   APPearance HAZY (A) CLEAR   Specific Gravity, Urine 1.017 1.005 - 1.030   pH 7.0 5.0 - 8.0   Glucose, UA NEGATIVE NEGATIVE mg/dL   Hgb urine dipstick NEGATIVE NEGATIVE   Bilirubin Urine NEGATIVE NEGATIVE   Ketones, ur NEGATIVE NEGATIVE mg/dL   Protein, ur NEGATIVE NEGATIVE  mg/dL   Nitrite NEGATIVE NEGATIVE   Leukocytes,Ua TRACE (A) NEGATIVE   RBC / HPF 0-5 0 - 5 RBC/hpf   WBC, UA 0-5 0 - 5 WBC/hpf   Bacteria, UA NONE SEEN NONE SEEN   Squamous Epithelial / LPF 0-5 0 - 5   Mucus PRESENT     Comment: Performed at [facility], [address] [place], [place] [address]  Pregnancy, urine POC     Status: None   Collection Time: 12/02/19  1:59 PM  Result Value Ref Range   Preg Test, Ur NEGATIVE NEGATIVE    Comment:        THE SENSITIVITY OF THIS METHODOLOGY IS >24 mIU/mL     No current facility-administered medications for this encounter.   Current Outpatient Medications  Medication Sig Dispense Refill  . desloratadine (CLARINEX) 5 MG tablet Take 1 tablet (5 mg total) by mouth daily. 30 tablet 1  . ibuprofen (ADVIL) 600 MG tablet Take 1 tablet (600 mg total) by mouth every 6 (six) hours as needed. 30 tablet 0  . montelukast (SINGULAIR) 10 MG tablet Take 10 mg by mouth daily.    . sertraline (ZOLOFT) 50 MG tablet Take 1 tablet (50 mg total) by mouth daily. 30 tablet 1  . traZODone (DESYREL) 50 MG tablet Take 1 tablet (50 mg total) by mouth at bedtime. 30 tablet 1    Musculoskeletal: Strength & Muscle Tone: within normal limits Gait & Station: normal Patient leans: N/A  Psychiatric Specialty Exam: Physical Exam  Review of Systems  Blood pressure 100/68, pulse 66, temperature 98.3 F (36.8 C), temperature source Oral, resp. rate 15, height 5\' 3"  (1.6 m), weight 71.7 kg, last menstrual period 11/03/2019, SpO2 99 %, not currently breastfeeding.Body mass index is 27.99 kg/m.  General Appearance: Casual  Eye Contact:  Fair  Speech:  Clear and Coherent  Volume:  Normal  Mood:  Anxious  Affect:  Appropriate and Congruent  Thought Process:  Coherent and Descriptions of Associations: Intact  Orientation:  Full (Time, Place, and Person)  Thought Content:  WDL and Logical  Suicidal Thoughts:  No  Homicidal Thoughts:  No  Memory:  Immediate;    Fair  Judgement:  Fair  Insight:  Fair  Psychomotor Activity:  Normal  Concentration:  Attention Span: Good  Recall:  Fair  Fund of Knowledge:  Good  Language:  Good  Akathisia:  NA  Handed:  Right  AIMS (if indicated):     Assets:  Communication Skills Desire for Improvement Financial Resources/Insurance Housing Social Support  ADL's:  Intact  Cognition:  WNL  Sleep:       Disposition: No evidence of imminent risk to self or others at present.   Patient does not meet criteria for psychiatric inpatient admission. Discussed crisis plan, support from social network, calling 911, coming to the Emergency Department, and calling Suicide Hotline.  Jearld Lesch, NP 12/02/2019 11:01 PM

## 2019-12-02 NOTE — BH Assessment (Signed)
Assessment Note  Amanda Fisher is an 29 y.o. female. Pt presented to the ED voluntarily. Per triage note Pt arrived via POV with reports of head pressure and feeling anxious, pt. states she took a dose of THC gummy Thursday night around 6pm to help with sleep. Pt states she has been having COVID-like sxs for about 2 weeks and husband tested positive last Saturday, pt. had negative test. Pt states she feels like her head is full and that she is floating upside down. Pt takes Zoloft for depression and anxiety.   Patient was calm and cooperative upon interview.  The patient presented with relevant and coherent speech and thought When asked what had brought her to the hospital the patient stated, My anxiety and depression came back in my dreams. Patient states that she was prescribed Zoloft by her PCP, which was working well until recently. The patient expressed that she and her husband had went on a trip with their church and they were exposed to Covid which was stressful. Patient reported no previous psychiatric admissions. Pt explained that she does not have a psychiatrist or a therapist.  The patient reported an increase of bizarre or strange dreams lately. The patient expressed that as a result of the dreams she is experiencing worsening depression and anxiety symptoms. The patient admitted to trying a gummy infused with THC that her friend provided to her to try to get better sleep. Upon taking the gummy, the patient reported symptoms of panic and paranoia. Pt currently denies any hallucinations, suicidal/homicidal ideations, or paranoia at this time.   Diagnosis: 296.32 Major depressive disorder, Recurrent episode, Moderate  Past Medical History:  Past Medical History:  Diagnosis Date   Acid reflux    Asthma    Depression    Hypoglycemia     Past Surgical History:  Procedure Laterality Date   TONSILLECTOMY AND ADENOIDECTOMY     WISDOM TOOTH EXTRACTION  2013    Family History:   Family History  Problem Relation Age of Onset   Cancer Mother 48       thyroid   Thyroid disease Mother    Hypertension Mother    Cancer Father 83       appendix - ca caused them to rupture   Hypertension Father    Cancer Paternal Grandmother 19       cervical    Social History:  reports that she has never smoked. She has never used smokeless tobacco. She reports current alcohol use. She reports that she does not use drugs.  Additional Social History:  Alcohol / Drug Use Pain Medications: See PTA Prescriptions: See PTA History of alcohol / drug use?: Yes Substance #1 Name of Substance 1: THC 1 - Last Use / Amount: 11-30-19  CIWA: CIWA-Ar BP: 100/68 Pulse Rate: 66 COWS:    Allergies:  Allergies  Allergen Reactions   Amoxicillin-Pot Clavulanate Anaphylaxis   Promethazine Other (See Comments)    Shaking, restless legs Other reaction(s): Other (See Comments) Twitchy limbs  Twitchy limbs  Shaking, restless legs Shaking, restless legs   Other     SEASONAL   Tamiflu [Oseltamivir] Other (See Comments)    depression   Fluoxetine Rash    Home Medications: (Not in a hospital admission)   OB/GYN Status:  Patient's last menstrual period was 11/03/2019 (exact date).  General Assessment Data Location of Assessment: [facility] ED TTS Assessment: In system Is this a Tele or Face-to-Face Assessment?: Tele Assessment Is this an Initial Assessment or a  Re-assessment for this encounter?: Initial Assessment Patient Accompanied by:: Other Language Other than English: No Living Arrangements: Other (Comment) What gender do you identify as?: Female Date Telepsych consult ordered in [facility]: 12/02/19 Time Telepsych consult ordered in [facility]: 1851 Marital status: Married Amanda Fisher name: n/a Pregnancy Status: No Living Arrangements: Spouse/significant other Can pt return to current living arrangement?: Yes Admission Status: Voluntary Is patient capable of signing voluntary  admission?: Yes Referral Source: Self/Family/Friend Insurance type: [place] Medicaid  Medical Screening Exam [facility] Walk-in ONLY) Medical Exam completed: Yes  Crisis Care Plan Living Arrangements: Spouse/significant other Legal Guardian: Other: (Self) Name of Psychiatrist: None noted Name of Therapist: None noted  Education Status Is patient currently in school?: No Is the patient employed, unemployed or receiving disability?: Employed  Risk to self with the past 6 months Suicidal Ideation: No Has patient been a risk to self within the past 6 months prior to admission? : No Suicidal Intent: No Has patient had any suicidal intent within the past 6 months prior to admission? : No Is patient at risk for suicide?: No Suicidal Plan?: No Has patient had any suicidal plan within the past 6 months prior to admission? : No Access to Means: No What has been your use of drugs/alcohol within the last 12 months?: THC Previous Attempts/Gestures: No How many times?: 0 Other Self Harm Risks: None noted Triggers for Past Attempts: None known Intentional Self Injurious Behavior: None Family Suicide History: Unknown Recent stressful life event(s): Conflict (Comment) Persecutory voices/beliefs?: No Depression: Yes Depression Symptoms: Feeling worthless/self pity Substance abuse history and/or treatment for substance abuse?: No Suicide prevention information given to non-admitted patients: Not applicable  Risk to Others within the past 6 months Homicidal Ideation: No Does patient have any lifetime risk of violence toward others beyond the six months prior to admission? : No Thoughts of Harm to Others: No Current Homicidal Intent: No Current Homicidal Plan: No Access to Homicidal Means: No Identified Victim: n/a History of harm to others?: No Assessment of Violence: None Noted Violent Behavior Description: n/a Does patient have access to weapons?: No Criminal Charges Pending?: No Does patient  have a court date: No Is patient on probation?: No  Psychosis Hallucinations: None noted Delusions: None noted  Mental Status Report Appearance/Hygiene: Unremarkable Eye Contact: Good Motor Activity: Freedom of movement Speech: Logical/coherent Level of Consciousness: Alert Mood: Pleasant Affect: Appropriate to circumstance Anxiety Level: Minimal Thought Processes: Coherent, Relevant Judgement: Unimpaired Orientation: Person, Place, Time, Situation Obsessive Compulsive Thoughts/Behaviors: None  Cognitive Functioning Concentration: Normal Memory: Recent Intact, Remote Intact Is patient IDD: No Insight: Good Impulse Control: Good Appetite: Good Have you had any weight changes? : No Change Sleep: No Change Vegetative Symptoms: None  ADLScreening [facility] Assessment Services) Patient's cognitive ability adequate to safely complete daily activities?: Yes Patient able to express need for assistance with ADLs?: Yes Independently performs ADLs?: Yes (appropriate for developmental age)  Prior Inpatient Therapy Prior Inpatient Therapy: No  Prior Outpatient Therapy Prior Outpatient Therapy: No Does patient have an ACCT team?: No Does patient have Intensive In-House Services?  : No Does patient have [facility] services? : No Does patient have P4CC services?: No  ADL Screening (condition at time of admission) Patient's cognitive ability adequate to safely complete daily activities?: Yes Is the patient deaf or have difficulty hearing?: No Does the patient have difficulty seeing, even when wearing glasses/contacts?: No Does the patient have difficulty concentrating, remembering, or making decisions?: No Patient able to express need for assistance with ADLs?:  Yes Does the patient have difficulty dressing or bathing?: No Independently performs ADLs?: Yes (appropriate for developmental age) Does the patient have difficulty walking or climbing stairs?: No Weakness of Legs:  None Weakness of Arms/Hands: None  Home Assistive Devices/Equipment Home Assistive Devices/Equipment: None  Therapy Consults (therapy consults require a physician order) PT Evaluation Needed: No OT Evalulation Needed: No SLP Evaluation Needed: No   Values / Beliefs Cultural Requests During Hospitalization: None Spiritual Requests During Hospitalization: None Consults Transition of Care Team Consult Needed: No Advance Directives (For Healthcare) Does Patient Have a Medical Advance Directive?: No Would patient like information on creating a medical advance directive?: No - Patient declined          Disposition: Per Rashaun, pt can be observed overnight, and reassessed in the AM.  Disposition Initial Assessment Completed for this Encounter: Yes  On Site Evaluation by:   Reviewed with Physician:    Foy Guadalajara 12/02/2019 10:50 PM

## 2020-04-06 NOTE — L&D Delivery Note (Addendum)
Operative Delivery Note At 5:09 PM a viable and healthy female was delivered via Vaginal, Vacuum Investment banker, operational).  Presentation: vertex; Position: Left,, Occiput,, Anterior; Station: +2. Fetal head had been OP and then transverse and rotated as she pushed. I was called into the room for fetal bradycardia. Heloise Ochoa, CNM had retrieved a flat kiwi and had two popoffs. I placed the vacuum appropriately after assessing the fetal position at LOA, and with a single long pull delivered the fetal head. Anterior shoulder followed with difficulty, no nuchal cord and he was placed on maternal abdomen for cutting the cord, and then transferred to the warmer for the NICU team.  Verbal consent: obtained from patient.  Risks and benefits discussed in detail.  Risks include, but are not limited to the risks of anesthesia, bleeding, infection, damage to maternal tissues, fetal cephalhematoma.  There is also the risk of inability to effect vaginal delivery of the head, or shoulder dystocia that cannot be resolved by established maneuvers, leading to the need for emergency cesarean section.  APGAR:see the newborn report ; weight 3180g .   Placenta status: spontaneous .   Cord: 3VC with the following complications: none.  Cord pH: 7.29  Anesthesia:  epidural Instruments: Flat Kiwi Episiotomy: None Lacerations: None Est. Blood Loss (mL):  50cc  Mom to postpartum.  Baby to Couplet care / Skin to Skin.   Both Duwayne Heck and Lurena Joiner were present for the delivery, and this can be counted for Danielle's deliveries  Christeen Douglas 12/06/2020, 5:24 PM

## 2020-05-07 DIAGNOSIS — Z3490 Encounter for supervision of normal pregnancy, unspecified, unspecified trimester: Secondary | ICD-10-CM | POA: Insufficient documentation

## 2020-06-01 IMAGING — US LIMITED OBSTETRIC ULTRASOUND
1 series · 14 of 28 positions shown · non-contrast
Comparison: none

CLINICAL DATA: Vaginal bleeding

EXAM:
LIMITED OBSTETRIC ULTRASOUND

[Series 1: limited obstetric ultrasound · 14 of 32 slices shown]
[im 2/32]
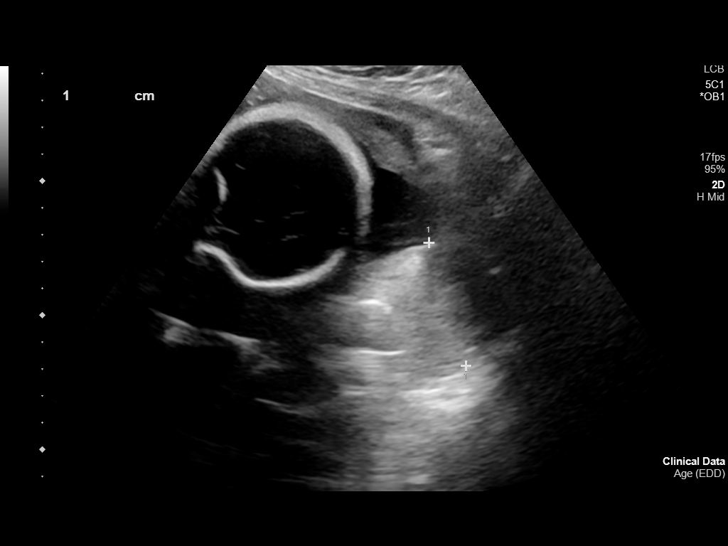
[im 4/32]
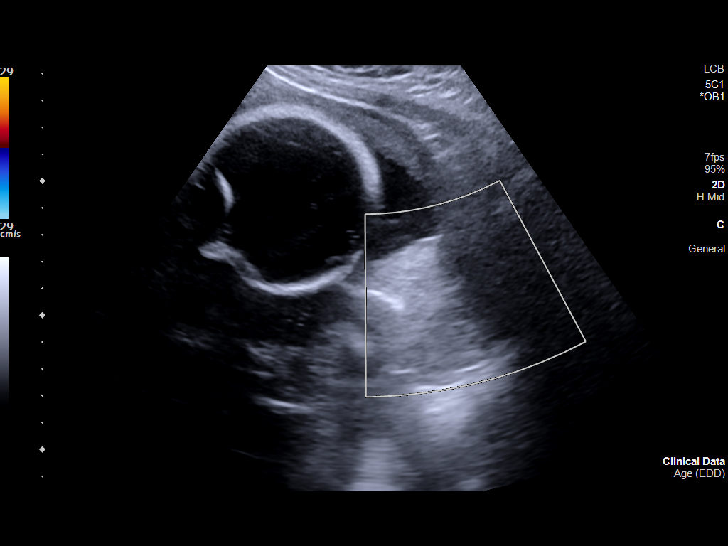
[im 6/32]
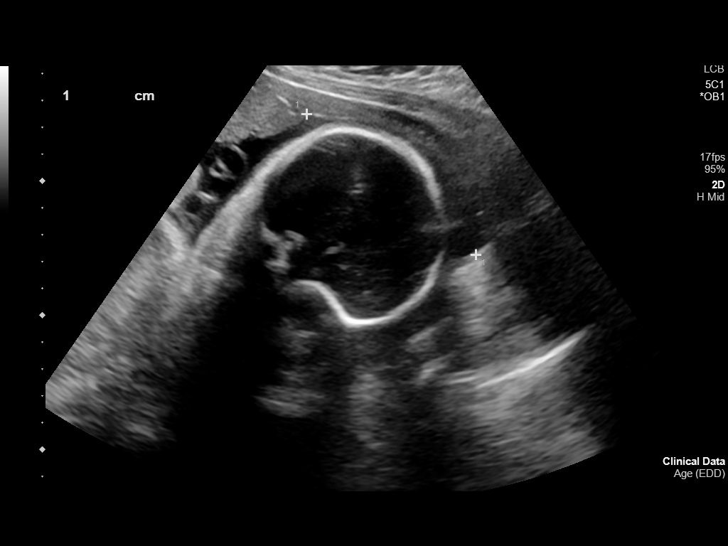
[im 9/32]
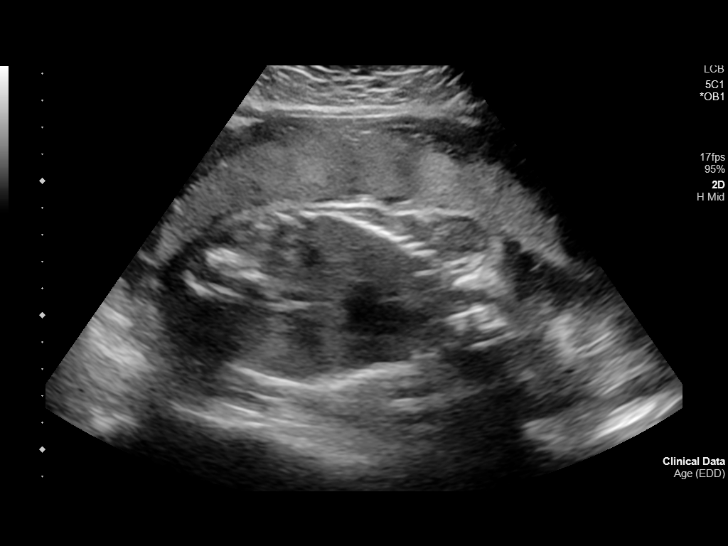
[im 11/32]
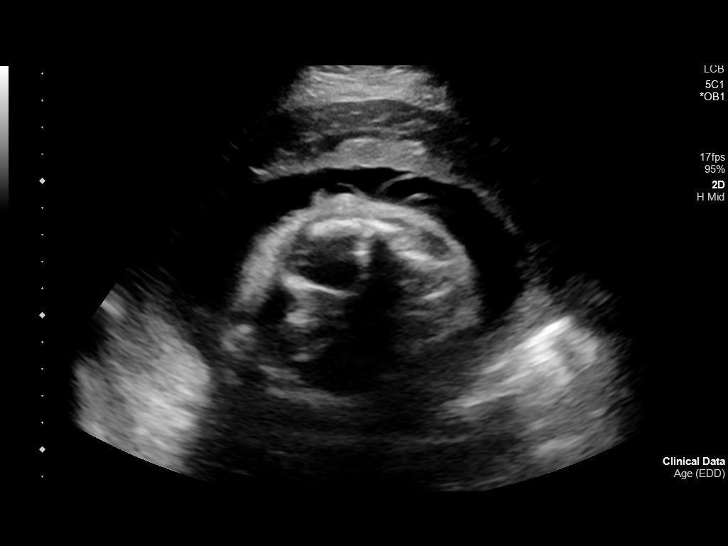
[im 13/32]
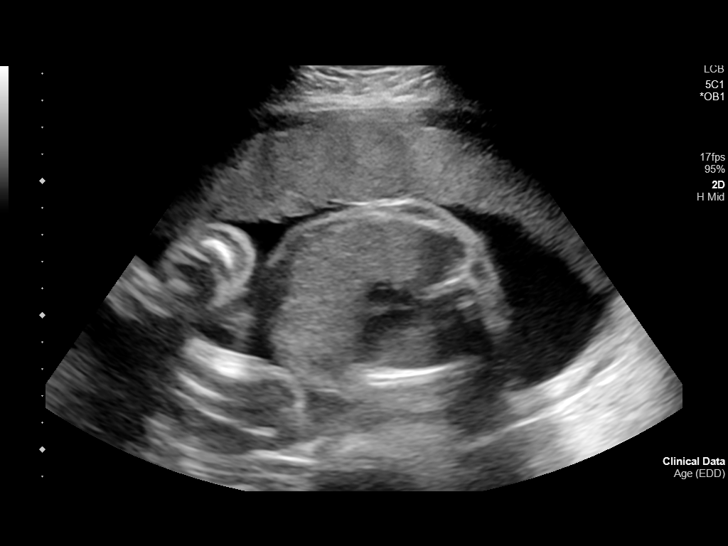
[im 15/32]
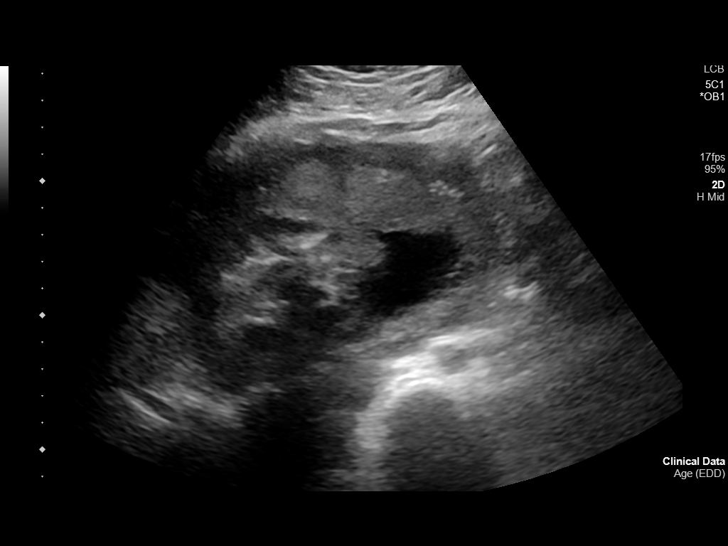
[im 18/32]
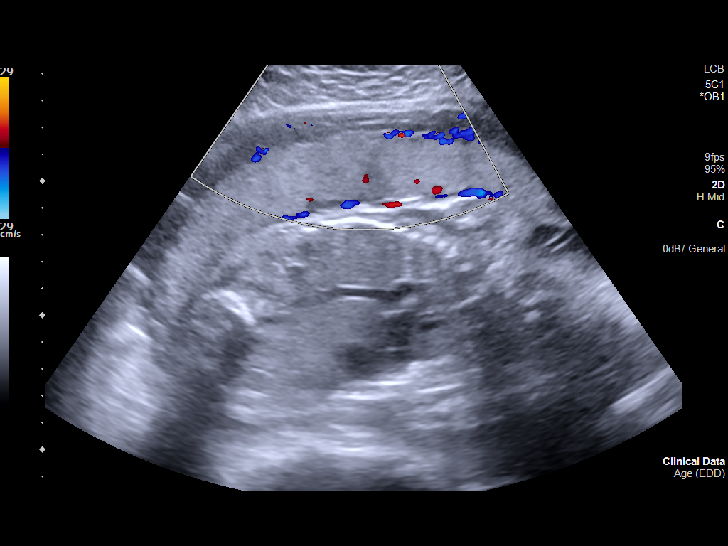
[im 20/32]
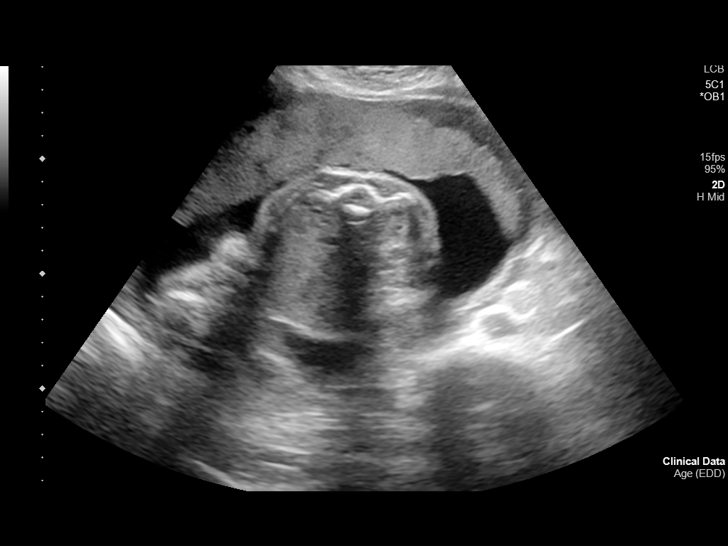
[im 22/32]
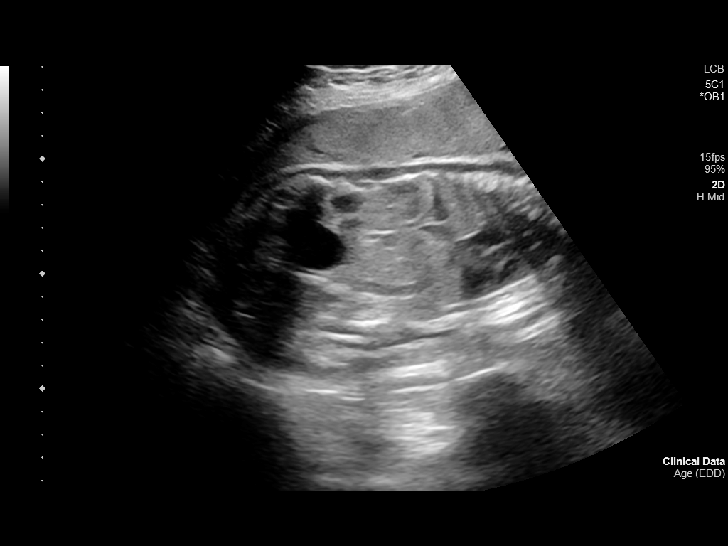
[im 25/32]
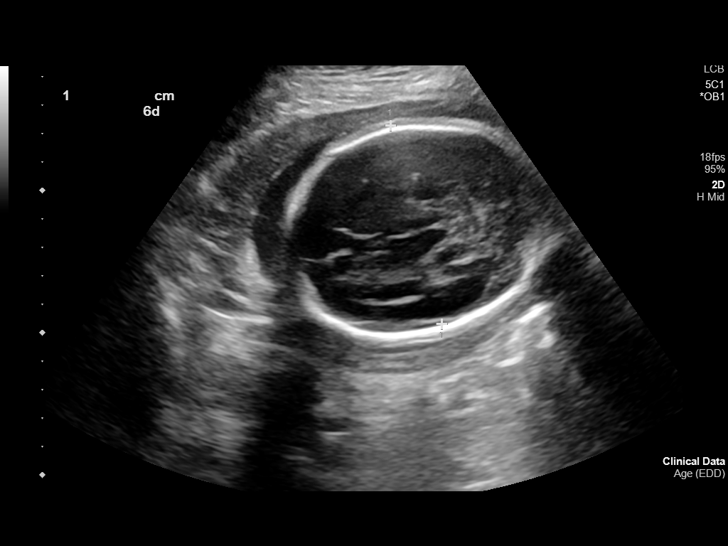
[im 27/32]
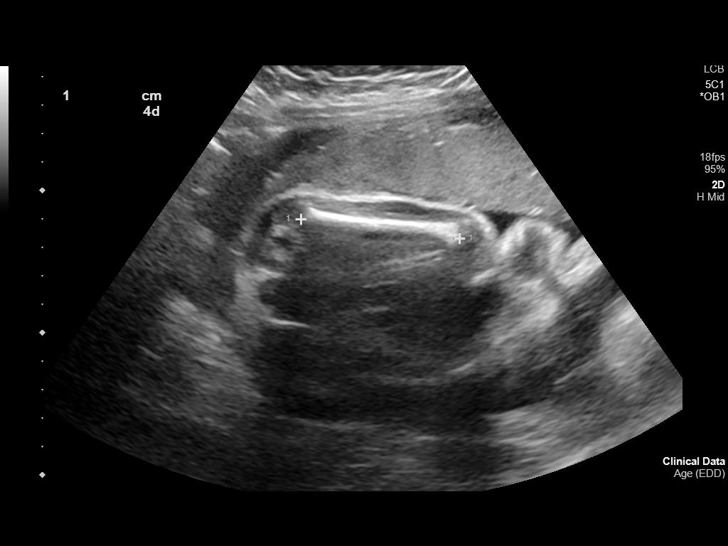
[im 29/32]
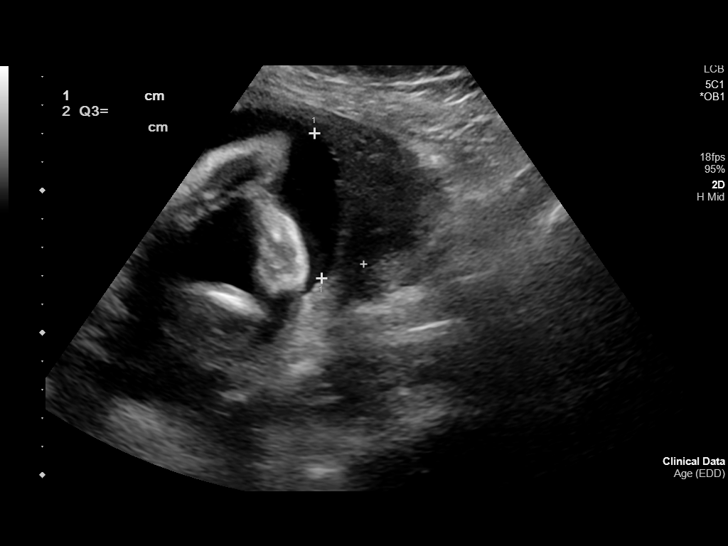
[im 32/32]
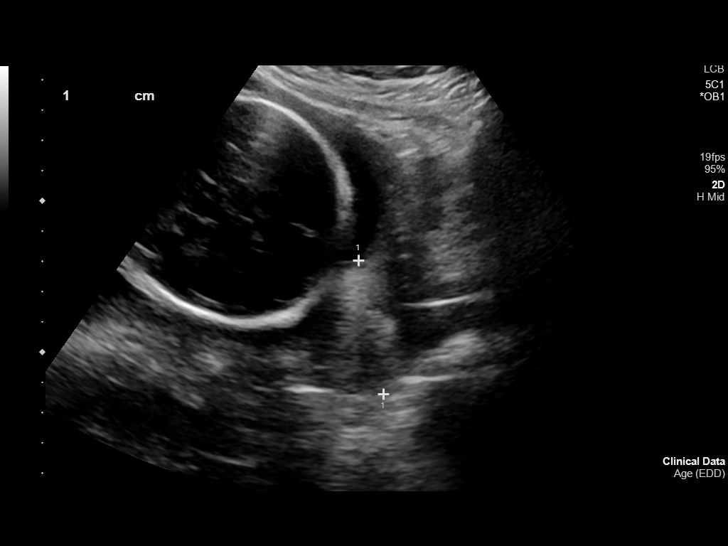

[14 of 28 positions shown; findings below may reference images not displayed]

FINDINGS: Number of Fetuses: 1

Heart Rate:  152 bpm

Movement: Yes

Presentation: Cephalic

Placental Location: Anterior fundal

Previa: No

Amniotic Fluid (Subjective):  Within normal limits.

AFI: 22 cm

BPD: 7.2 cm 28 w  6 d

MATERNAL FINDINGS:

Cervix:  Closed.  Cervical length 4.2 cm.

Uterus/Adnexae: No abnormality visualized.
IMPRESSION: Single live intrauterine pregnancy as detailed above.

This exam is performed on an emergent basis and does not
comprehensively evaluate fetal size, dating, or anatomy; follow-up
complete OB US should be considered if further fetal assessment is
warranted.

## 2020-08-22 ENCOUNTER — Other Ambulatory Visit: Payer: Self-pay

## 2020-08-22 ENCOUNTER — Ambulatory Visit
Admission: RE | Admit: 2020-08-22 | Discharge: 2020-08-22 | Disposition: A | Discharge status: Home / Self Care | Payer: Medicaid Other | Source: Ambulatory Visit | Attending: Sports Medicine | Admitting: Sports Medicine

## 2020-08-22 VITALS — BP 108/62 | HR 106 | Temp 98.6°F | Resp 18 | Ht 63.0 in | Wt 180.0 lb

## 2020-08-22 DIAGNOSIS — Z28310 Unvaccinated for covid-19: Secondary | ICD-10-CM | POA: Diagnosis not present

## 2020-08-22 DIAGNOSIS — R0981 Nasal congestion: Secondary | ICD-10-CM | POA: Insufficient documentation

## 2020-08-22 DIAGNOSIS — M791 Myalgia, unspecified site: Secondary | ICD-10-CM | POA: Diagnosis not present

## 2020-08-22 DIAGNOSIS — Z20822 Contact with and (suspected) exposure to covid-19: Secondary | ICD-10-CM | POA: Diagnosis not present

## 2020-08-22 DIAGNOSIS — R062 Wheezing: Secondary | ICD-10-CM | POA: Diagnosis not present

## 2020-08-22 DIAGNOSIS — R0982 Postnasal drip: Secondary | ICD-10-CM | POA: Diagnosis not present

## 2020-08-22 DIAGNOSIS — J069 Acute upper respiratory infection, unspecified: Secondary | ICD-10-CM | POA: Insufficient documentation

## 2020-08-22 DIAGNOSIS — B349 Viral infection, unspecified: Secondary | ICD-10-CM | POA: Insufficient documentation

## 2020-08-22 LAB — RAPID INFLUENZA A&B ANTIGENS
Influenza A (ARMC): NEGATIVE
Influenza B (ARMC): NEGATIVE

## 2020-08-22 NOTE — Discharge Instructions (Addendum)
Your influenza test was negative. Your COVID test is pending.  Someone will contact you if it is positive.  If it is then they will send in one of the newer COVID oral medications.  However, I would asked that you contact your OB before initiating this medication. Regarding her history of asthma, you having inhaler that you recently got from your OB.  I encourage you to take it.  You do have wheezing on exam so you need to use it.  We will hold on the prednisone as we want to minimize medicines given that you are 5 months pregnant.  If your symptoms change you may need a prednisone taper. Please see educational handouts. Use Tylenol for any fever or discomfort. If your symptoms persist please see your PCP.  If they worsen please go to the ER. I wanted to prescribe something for your cough, but given you are 5 months pregnant I would recommend over-the-counter cough medicine as needed..  Continue with your asthma and your seasonal allergy medications.

## 2020-08-22 NOTE — ED Provider Notes (Signed)
[facility]    CSN: [id number] Arrival date & time: 08/22/20  1804      History   Chief Complaint Chief Complaint  Patient presents with  . Cough  . Nasal Congestion         HPI Amanda Fisher is a 30 y.o. female.   30 year old female who presents for evaluation the above issue.  Normally sees [facility] here in [place].  Could not get an appointment today.  She stays at home and takes care of her newborn.  She reports 5 days of URI symptoms including cough, nasal congestion, chills, postnasal drip.  She also reports myalgias.  She denies any significant fever.  No sinus pressure or ear pain.  No chest pain or shortness of breath.  She does have a history of asthma and she says she uses an albuterol inhaler.  She got that from her OB recently.  She also has seasonal allergies and takes 2 medications for that.  She has not been vaccinated against COVID or influenza.  She reports that she has not had any COVID or influenza exposure that she is aware of.  She did have COVID back in August 2021.  No red flag signs or symptoms elicited on history.     Past Medical History:  Diagnosis Date  . Acid reflux   . Asthma   . Depression   . Hypoglycemia     Patient Active Problem List   Diagnosis Date Noted  . 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 Suicide attempt by drug ingestion (HCC) 08/07/2019  . 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 OCD (obsessive compulsive disorder) 08/07/2019  . Diphenhydramine overdose 08/07/2019  . Asthma 04/09/2019  . Vaginal bleeding in pregnancy, third trimester 08/06/2018  . Supervision of normal first pregnancy, antepartum 04/21/2017    Past Surgical History:  Procedure Laterality Date  . TONSILLECTOMY AND ADENOIDECTOMY    . WISDOM TOOTH EXTRACTION  2013    OB History    Gravida  3   Para  1   Term  1   Preterm      AB  1   Living  1     SAB  1   IAB      Ectopic       Multiple      Live Births  1            Home Medications    Prior to Admission medications   Medication Sig Start Date End Date Taking? Authorizing Provider  desloratadine (CLARINEX) 5 MG tablet Take 1 tablet (5 mg total) by mouth daily. 09/23/19  Yes Lamptey, Britta Mccreedy, MD  montelukast (SINGULAIR) 10 MG tablet Take 10 mg by mouth daily. 08/11/19  Yes [provider]  sertraline (ZOLOFT) 50 MG tablet Take 1 tablet (50 mg total) by mouth daily. 08/10/19  Yes Clapacs, Jackquline Denmark, MD  ibuprofen (ADVIL) 600 MG tablet Take 1 tablet (600 mg total) by mouth every 6 (six) hours as needed. 09/23/19   Merrilee Jansky, MD  traZODone (DESYREL) 50 MG tablet Take 1 tablet (50 mg total) by mouth at bedtime. 08/09/19   Clapacs, Jackquline Denmark, MD    Family History Family History  Problem Relation Age of Onset  . Cancer Mother 20       thyroid  . Thyroid disease Mother   . Hypertension Mother   . Cancer Father 37       appendix -  ca caused them to rupture  . Hypertension Father   . Cancer Paternal Grandmother 52       cervical    Social History Social History   Tobacco Use  . Smoking status: Never Smoker  . Smokeless tobacco: Never Used  Vaping Use  . Vaping Use: Former  Substance Use Topics  . Alcohol use: Not Currently    Comment: occasionally  . Drug use: No     Allergies   Amoxicillin-pot clavulanate, Promethazine, Other, Tamiflu [oseltamivir], and Fluoxetine   Review of Systems Review of Systems  Constitutional: Positive for activity change, chills and fatigue. Negative for appetite change, diaphoresis and fever.  HENT: Positive for congestion and postnasal drip. Negative for ear pain, rhinorrhea, sinus pressure, sinus pain, sneezing and sore throat.   Eyes: Negative for pain.  Respiratory: Positive for cough and wheezing. Negative for chest tightness and shortness of breath.   Cardiovascular: Negative for chest pain and palpitations.  Gastrointestinal: Negative for abdominal  pain, diarrhea, nausea and vomiting.  Genitourinary: Negative for dysuria.  Musculoskeletal: Positive for myalgias. Negative for back pain, neck pain and neck stiffness.  Skin: Negative for color change, pallor, rash and wound.  Neurological: Negative for dizziness, light-headedness and headaches.  All other systems reviewed and are negative.    Physical Exam Triage Vital Signs ED Triage Vitals  Enc Vitals Group     BP 08/22/20 1815 108/62     Pulse Rate 08/22/20 1815 (!) 106     Resp 08/22/20 1815 18     Temp 08/22/20 1815 98.6 F (37 C)     Temp Source 08/22/20 1815 Oral     SpO2 08/22/20 1815 97 %     Weight 08/22/20 1812 180 lb (81.6 kg)     Height 08/22/20 1812 5\' 3"  (1.6 m)     Head Circumference --      Peak Flow --      Pain Score 08/22/20 1812 0     Pain Loc --      Pain Edu? --      Excl. in GC? --    No data found.  Updated Vital Signs BP 108/62 (BP Location: Right Arm)   Pulse (!) 106   Temp 98.6 F (37 C) (Oral)   Resp 18   Ht 5\' 3"  (1.6 m)   Wt 81.6 kg   LMP 11/03/2019 (Exact Date)   SpO2 97%   BMI 31.89 kg/m   Visual Acuity Right Eye Distance:   Left Eye Distance:   Bilateral Distance:    Right Eye Near:   Left Eye Near:    Bilateral Near:     Physical Exam Vitals and nursing note reviewed.  Constitutional:      General: She is not in acute distress.    Appearance: Normal appearance. She is ill-appearing. She is not toxic-appearing or diaphoretic.  HENT:     Head: Normocephalic and atraumatic.     Right Ear: Tympanic membrane normal.     Left Ear: Tympanic membrane normal.     Nose: Congestion and rhinorrhea present.     Mouth/Throat:     Mouth: Mucous membranes are moist.     Pharynx: Posterior oropharyngeal erythema present. No oropharyngeal exudate.  Eyes:     General: No scleral icterus.       Right eye: No discharge.        Left eye: No discharge.     Conjunctiva/sclera: Conjunctivae normal.     Pupils: Pupils  are equal,  round, and reactive to light.  Cardiovascular:     Rate and Rhythm: Normal rate and regular rhythm.     Pulses: Normal pulses.     Heart sounds: Normal heart sounds. No murmur heard. No friction rub. No gallop.   Pulmonary:     Effort: Pulmonary effort is normal. No respiratory distress.     Breath sounds: No stridor or decreased air movement. Examination of the right-upper field reveals wheezing. Examination of the left-upper field reveals wheezing. Examination of the right-middle field reveals wheezing. Examination of the left-middle field reveals wheezing. Examination of the right-lower field reveals wheezing. Examination of the left-lower field reveals wheezing. Wheezing present. No decreased breath sounds, rhonchi or rales.     Comments: Diffuse wheeze heard in all lung fields.  Its mild. Musculoskeletal:     Cervical back: Normal range of motion and neck supple. No rigidity or tenderness.  Lymphadenopathy:     Cervical: Cervical adenopathy present.  Skin:    General: Skin is warm and dry.     Capillary Refill: Capillary refill takes less than 2 seconds.  Neurological:     General: No focal deficit present.     Mental Status: She is alert and oriented to person, place, and time.  Psychiatric:        Mood and Affect: Mood normal.      UC Treatments / Results  Labs (all labs ordered are listed, but only abnormal results are displayed) Labs Reviewed  RAPID INFLUENZA A&B ANTIGENS  SARS CORONAVIRUS 2 (TAT 6-24 HRS)    EKG   Radiology No results found.  Procedures Procedures (including critical care time)  Medications Ordered in UC Medications - No data to display  Initial Impression / Assessment and Plan / UC Course  I have reviewed the triage vital signs and the nursing notes.  Pertinent labs & imaging results that were available during my care of the patient were reviewed by me and considered in my medical decision making (see chart for details).  Clinical  impression: 5 days of URI symptoms including cough, nasal congestion, chills, postnasal drip, and rhinorrhea as well as myalgia.  Concerning for possible COVID and/or influenza.  Treatment plan: 1.  The findings and treatment plan were discussed in detail with the patient.  Patient was in agreement. 2.  Will obtain influenza testing.  It was negative. 3.  We will also obtain COVID testing.  She is 5 months pregnant so she is not a candidate for molnupiravir. If her test it comes back positive given her asthma she may be a good candidate for Paxlovid.  Someone will contact her if she is positive and prescribe that for her, but I would ask her to check with her OB before initiating the medication. 4.  Educational handouts provided. 5.  Plenty of rest, plenty fluids, Tylenol only for any fever or discomfort. 6.  If symptoms persist she should see her primary care provider. 7.  If they worsen she should go to the ER. 8.  I wanted to prescribe something for her cough, but given that she is 5 months pregnant I would recommend she just use over-the-counter cough medicine such as Delsym or Robitussin as needed..  She has her asthma meds and her allergy meds.  She was wheezing on examination.  1 to minimize medications as she is 5 months pregnant.  We will hold on the prednisone but she may need this if her wheezing was to worsen. 9.  She was discharged from care in stable condition and she will follow-up here as needed.    Final Clinical Impressions(s) / UC Diagnoses   Final diagnoses:  Viral URI with cough  Nasal congestion  Post-nasal drip  Wheeze  Myalgia  Viral illness     Discharge Instructions     Your influenza test was negative. Your COVID test is pending.  Someone will contact you if it is positive.  If it is then they will send in one of the newer COVID oral medications.  However, I would asked that you contact your OB before initiating this medication. Please see educational  handouts. Use Tylenol for any fever or discomfort. If your symptoms persist please see your PCP.  If they worsen please go to the ER. I wanted to prescribe something for your cough, but given you are 5 months pregnant I would recommend over-the-counter cough medicine as needed..  Continue with your asthma and your seasonal allergy medications.    ED Prescriptions    None     PDMP not reviewed this encounter.   Delton See, MD 08/24/20 [id number]

## 2020-08-22 NOTE — ED Triage Notes (Signed)
Patient c/o cough, nasal congestion, body aches that started on Sunday. Patient denies fever.

## 2020-08-23 ENCOUNTER — Ambulatory Visit
Admission: RE | Admit: 2020-08-23 | Discharge: 2020-08-23 | Disposition: A | Discharge status: Home / Self Care | Payer: Medicaid Other | Source: Ambulatory Visit | Attending: Emergency Medicine | Admitting: Emergency Medicine

## 2020-08-23 VITALS — BP 111/73 | HR 98 | Temp 98.4°F | Resp 14 | Ht 63.0 in | Wt 179.9 lb

## 2020-08-23 DIAGNOSIS — H66001 Acute suppurative otitis media without spontaneous rupture of ear drum, right ear: Secondary | ICD-10-CM | POA: Diagnosis not present

## 2020-08-23 DIAGNOSIS — J4521 Mild intermittent asthma with (acute) exacerbation: Secondary | ICD-10-CM | POA: Diagnosis not present

## 2020-08-23 LAB — SARS CORONAVIRUS 2 (TAT 6-24 HRS): SARS Coronavirus 2: NEGATIVE

## 2020-08-23 MED ORDER — DOXYCYCLINE HYCLATE 100 MG PO CAPS: 100.0000 mg | ORAL_CAPSULE | Freq: Two times a day (BID) | ORAL | 0 refills | Status: DC

## 2020-08-23 MED ORDER — PREDNISONE 10 MG PO TABS: 20.0000 mg | ORAL_TABLET | Freq: Every day | ORAL | 0 refills | Status: DC

## 2020-08-23 MED ORDER — AEROCHAMBER MV MISC: 2 refills | Status: AC

## 2020-08-23 NOTE — ED Provider Notes (Signed)
[facility]    CSN: [id number] Arrival date & time: 08/23/20  1515      History   Chief Complaint Chief Complaint  Patient presents with  . Appointment  . Cough    HPI Amanda Fisher is a 30 y.o. female.   HPI   30 year old female here for evaluation of URI symptoms.  Patient was evaluated in this urgent care yesterday for upper respiratory symptoms that have been going on for the past 4 days and had a negative COVID and flu test at that time.  Patient is return for reevaluation because of increasing wheezing and shortness of breath with chest tightness, nasal congestion, and a productive cough for green sputum.  History of asthma and she reports that she is using her inhaler but she is only using it 1 time a day at bedtime.  Past Medical History:  Diagnosis Date  . Acid reflux   . Asthma   . Depression   . Hypoglycemia     Patient Active Problem List   Diagnosis Date Noted  . 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 Suicide attempt by drug ingestion (HCC) 08/07/2019  . 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 OCD (obsessive compulsive disorder) 08/07/2019  . Diphenhydramine overdose 08/07/2019  . Asthma 04/09/2019  . Vaginal bleeding in pregnancy, third trimester 08/06/2018  . Supervision of normal first pregnancy, antepartum 04/21/2017    Past Surgical History:  Procedure Laterality Date  . TONSILLECTOMY AND ADENOIDECTOMY    . WISDOM TOOTH EXTRACTION  2013    OB History    Gravida  3   Para  1   Term  1   Preterm      AB  1   Living  1     SAB  1   IAB      Ectopic      Multiple      Live Births  1            Home Medications    Prior to Admission medications   Medication Sig Start Date End Date Taking? Authorizing Provider  doxycycline (VIBRAMYCIN) 100 MG capsule Take 1 capsule (100 mg total) by mouth 2 (two) times daily. 08/23/20  Yes Becky Augusta, NP  predniSONE (DELTASONE) 10 MG  tablet Take 2 tablets (20 mg total) by mouth daily. 08/23/20  Yes Becky Augusta, NP  Spacer/Aero-Holding Chambers (AEROCHAMBER MV) inhaler Use as instructed 08/23/20  Yes Becky Augusta, NP  desloratadine (CLARINEX) 5 MG tablet Take 1 tablet (5 mg total) by mouth daily. 09/23/19   Lamptey, Britta Mccreedy, MD  montelukast (SINGULAIR) 10 MG tablet Take 10 mg by mouth daily. 08/11/19   [provider]  sertraline (ZOLOFT) 50 MG tablet Take 1 tablet (50 mg total) by mouth daily. 08/10/19   Clapacs, Jackquline Denmark, MD  traZODone (DESYREL) 50 MG tablet Take 1 tablet (50 mg total) by mouth at bedtime. 08/09/19   Clapacs, Jackquline Denmark, MD    Family History Family History  Problem Relation Age of Onset  . Cancer Mother 20       thyroid  . Thyroid disease Mother   . Hypertension Mother   . Cancer Father 53       appendix - ca caused them to rupture  . Hypertension Father   . Cancer Paternal Grandmother 72       cervical    Social History Social History   Tobacco Use  . Smoking status: Never Smoker  .  Smokeless tobacco: Never Used  Vaping Use  . Vaping Use: Former  Substance Use Topics  . Alcohol use: Not Currently    Comment: occasionally  . Drug use: No     Allergies   Amoxicillin-pot clavulanate, Promethazine, Other, Tamiflu [oseltamivir], and Fluoxetine   Review of Systems Review of Systems  Constitutional: Negative for activity change, appetite change and fever.  HENT: Positive for congestion. Negative for ear pain and sore throat.   Respiratory: Positive for cough, chest tightness, shortness of breath and wheezing.   Gastrointestinal: Negative for diarrhea, nausea and vomiting.  Skin: Negative for rash.  Hematological: Negative.   Psychiatric/Behavioral: Negative.      Physical Exam Triage Vital Signs ED Triage Vitals  Enc Vitals Group     BP 08/23/20 1522 111/73     Pulse Rate 08/23/20 1522 98     Resp 08/23/20 1522 14     Temp 08/23/20 1522 98.4 F (36.9 C)     Temp Source  08/23/20 1522 Oral     SpO2 08/23/20 1522 96 %     Weight 08/23/20 1520 179 lb 14.3 oz (81.6 kg)     Height 08/23/20 1520 5\' 3"  (1.6 m)     Head Circumference --      Peak Flow --      Pain Score 08/23/20 1520 0     Pain Loc --      Pain Edu? --      Excl. in GC? --    No data found.  Updated Vital Signs BP 111/73 (BP Location: Right Arm)   Pulse 98   Temp 98.4 F (36.9 C) (Oral)   Resp 14   Ht 5\' 3"  (1.6 m)   Wt 179 lb 14.3 oz (81.6 kg)   LMP 11/03/2019 (Exact Date)   SpO2 96%   BMI 31.87 kg/m   Visual Acuity Right Eye Distance:   Left Eye Distance:   Bilateral Distance:    Right Eye Near:   Left Eye Near:    Bilateral Near:     Physical Exam Vitals and nursing note reviewed.  Constitutional:      General: She is not in acute distress.    Appearance: Normal appearance. She is ill-appearing.  HENT:     Head: Normocephalic and atraumatic.     Right Ear: Ear canal and external ear normal.     Left Ear: Tympanic membrane, ear canal and external ear normal.     Nose: Congestion and rhinorrhea present.     Mouth/Throat:     Mouth: Mucous membranes are moist.     Pharynx: Oropharynx is clear.  Cardiovascular:     Rate and Rhythm: Normal rate and regular rhythm.     Pulses: Normal pulses.     Heart sounds: Normal heart sounds. No murmur heard. No gallop.   Pulmonary:     Effort: Pulmonary effort is normal.     Breath sounds: Wheezing present. No rhonchi or rales.  Musculoskeletal:     Cervical back: Normal range of motion and neck supple.  Lymphadenopathy:     Cervical: No cervical adenopathy.  Skin:    General: Skin is warm and dry.     Capillary Refill: Capillary refill takes less than 2 seconds.     Findings: No erythema or rash.  Neurological:     General: No focal deficit present.     Mental Status: She is alert and oriented to person, place, and time.  Psychiatric:  Mood and Affect: Mood normal.        Behavior: Behavior normal.         Thought Content: Thought content normal.        Judgment: Judgment normal.      UC Treatments / Results  Labs (all labs ordered are listed, but only abnormal results are displayed) Labs Reviewed - No data to display  EKG   Radiology No results found.  Procedures Procedures (including critical care time)  Medications Ordered in UC Medications - No data to display  Initial Impression / Assessment and Plan / UC Course  I have reviewed the triage vital signs and the nursing notes.  Pertinent labs & imaging results that were available during my care of the patient were reviewed by me and considered in my medical decision making (see chart for details).   30 year old female presents for evaluation of respiratory symptoms that been present for last 4 days with worsening chest tightness, wheezing, shortness of breath, and productive cough.  Patient is also 5 months pregnant.  Patient reports that she has had some nasal congestion but her bigger complaints are her respiratory complaints.  She has not had a fever.  She was evaluated yesterday and had a negative COVID and flu test.  Physical exam today reveals an erythematous and injected right tympanic membrane with a loss of landmarks.  Nasal mucosa is erythematous and edematous with clear nasal discharge.  Oropharyngeal exam shows mild cobblestoning and clear postnasal drip.  No cervical lymphadenopathy on exam.  Cardiopulmonary exam reveals the presence of decreased lung sounds and wheezing in all fields.  I discussed with the patient that oral prednisone is converted by the liver into its metabolite but is alone but then converted back into prednisone by the placenta meaning that the baby does get some prednisone.  The maternal risk of asthma I believe is greater risk than the potential harm from short course of oral prednisone.  We will treat patient with 40 mg daily for 5 days, will prescribe a spacer for patient to use with her inhaler and  have her increase her use to 2 puffs every 4-6 hours as needed for her shortness breath or wheezing, and will place on Doxycycline 100 mg twice daily for treatment of her otitis media.   Final Clinical Impressions(s) / UC Diagnoses   Final diagnoses:  Mild intermittent asthma with exacerbation  Non-recurrent acute suppurative otitis media of right ear without spontaneous rupture of tympanic membrane     Discharge Instructions     Take the doxycycline twice daily with food for treatment of your ear infection.  Take the prednisone 40 mg daily for 5 days for treatment of your asthma.  Use albuterol inhaler with a spacer, 2 puffs every 4-6 hours, for shortness of breath and wheezing.    ED Prescriptions    Medication Sig Dispense Auth. Provider   predniSONE (DELTASONE) 10 MG tablet Take 2 tablets (20 mg total) by mouth daily. 10 tablet Becky Augusta, NP   Spacer/Aero-Holding Chambers (AEROCHAMBER MV) inhaler Use as instructed 1 each Becky Augusta, NP   doxycycline (VIBRAMYCIN) 100 MG capsule Take 1 capsule (100 mg total) by mouth 2 (two) times daily. 20 capsule Becky Augusta, NP     PDMP not reviewed this encounter.   Becky Augusta, NP 08/23/20 [phone]

## 2020-08-23 NOTE — ED Triage Notes (Signed)
Patient states that she was seen here for URI symptoms.  Patient states that her covid and flu test were negative.  Patient states that her OB told her that she needed to come back here to get an antibiotic, steroid and an inhaler. Patient is currently 5 months pregnant.  Patient reports history of asthma.

## 2020-08-23 NOTE — Discharge Instructions (Signed)
Take the doxycycline twice daily with food for treatment of your ear infection.  Take the prednisone 40 mg daily for 5 days for treatment of your asthma.  Use albuterol inhaler with a spacer, 2 puffs every 4-6 hours, for shortness of breath and wheezing.

## 2020-11-28 ENCOUNTER — Other Ambulatory Visit: Payer: Self-pay

## 2020-11-28 ENCOUNTER — Observation Stay
Admission: EM | Admit: 2020-11-28 | Discharge: 2020-11-28 | Disposition: A | Discharge status: Home / Self Care | Payer: Medicaid Other

## 2020-11-28 DIAGNOSIS — E669 Obesity, unspecified: Secondary | ICD-10-CM | POA: Insufficient documentation

## 2020-11-28 DIAGNOSIS — Z3A38 38 weeks gestation of pregnancy: Secondary | ICD-10-CM | POA: Insufficient documentation

## 2020-11-28 DIAGNOSIS — Z79899 Other long term (current) drug therapy: Secondary | ICD-10-CM | POA: Insufficient documentation

## 2020-11-28 DIAGNOSIS — O471 False labor at or after 37 completed weeks of gestation: Secondary | ICD-10-CM | POA: Diagnosis not present

## 2020-11-28 DIAGNOSIS — O479 False labor, unspecified: Secondary | ICD-10-CM | POA: Diagnosis present

## 2020-11-28 DIAGNOSIS — O99213 Obesity complicating pregnancy, third trimester: Secondary | ICD-10-CM | POA: Diagnosis not present

## 2020-11-28 DIAGNOSIS — O99513 Diseases of the respiratory system complicating pregnancy, third trimester: Secondary | ICD-10-CM | POA: Insufficient documentation

## 2020-11-28 MED ORDER — CALCIUM CARBONATE ANTACID 500 MG PO CHEW: 2.0000 | CHEWABLE_TABLET | ORAL | Status: DC | PRN

## 2020-11-28 MED ORDER — ACETAMINOPHEN 500 MG PO TABS: 1000.0000 mg | ORAL_TABLET | Freq: Four times a day (QID) | ORAL | Status: DC | PRN

## 2020-11-28 NOTE — Discharge Summary (Signed)
Amanda Fisher is a 30 y.o. female. She is at [redacted]w[redacted]d gestation. No LMP recorded. Patient is pregnant. Estimated Date of Delivery: 12/06/20  Prenatal care site: [facility]  Chief complaint: cramping and mild contractions   HPI: Amanda Fisher presents to L&D with complaints of contractions that have been irregular since last night.  States that they have been anywhere from 5-10 minutes but have not been increasing in strength or frequency.  Denies LOF or vaginal bleeding, endorses good fetal movement.    Factors complicating pregnancy: Depression and anxiety with history of suicide attempt 08/2019 Obesity in pregnancy Asthma  Reactive RPR - T. Pallidium Non-reactive   S: Resting comfortably. Irregular CTX, no VB.no LOF,  Active fetal movement.   Maternal Medical History:  Past Medical Hx:  has a past medical history of Acid reflux, Asthma, Depression, and Hypoglycemia.    Past Surgical Hx:  has a past surgical history that includes Tonsillectomy and adenoidectomy and Wisdom tooth extraction (2013).   Allergies  Allergen Reactions   Amoxicillin-Pot Clavulanate Anaphylaxis   Promethazine Other (See Comments)    Shaking, restless legs Other reaction(s): Other (See Comments) Twitchy limbs  Twitchy limbs  Shaking, restless legs Shaking, restless legs   Other     SEASONAL   Tamiflu [Oseltamivir] Other (See Comments)    depression   Fluoxetine Rash     Prior to Admission medications   Medication Sig Start Date End Date Taking? Authorizing Provider  desloratadine (CLARINEX) 5 MG tablet Take 1 tablet (5 mg total) by mouth daily. 09/23/19  Yes Lamptey, Britta Mccreedy, MD  montelukast (SINGULAIR) 10 MG tablet Take 10 mg by mouth daily. 08/11/19  Yes [provider]  predniSONE (DELTASONE) 10 MG tablet Take 2 tablets (20 mg total) by mouth daily. 08/23/20  Yes Becky Augusta, NP  sertraline (ZOLOFT) 50 MG tablet Take 1 tablet (50 mg total) by mouth daily. 08/10/19  Yes Clapacs, Jackquline Denmark, MD   Spacer/Aero-Holding Chambers (AEROCHAMBER MV) inhaler Use as instructed 08/23/20  Yes Becky Augusta, NP  traZODone (DESYREL) 50 MG tablet Take 1 tablet (50 mg total) by mouth at bedtime. 08/09/19  Yes Clapacs, Jackquline Denmark, MD  doxycycline (VIBRAMYCIN) 100 MG capsule Take 1 capsule (100 mg total) by mouth 2 (two) times daily. Patient not taking: Reported on 11/28/2020 08/23/20   Becky Augusta, NP    Social History: She  reports that she has never smoked. She has never used smokeless tobacco. She reports that she does not currently use alcohol. She reports that she does not use drugs.  Family History: family history includes Cancer (age of onset: 61) in her mother; Cancer (age of onset: 61) in her father; Cancer (age of onset: 88) in her paternal grandmother; Hypertension in her father and mother; Thyroid disease in her mother.   Review of Systems: A full review of systems was performed and negative except as noted in the HPI.    O:  BP 122/82 (BP Location: Right Arm)   Pulse 89   Temp 98.1 F (36.7 C) (Oral)   Resp 16  No results found for this or any previous visit (from the past 48 hour(s)).   Constitutional: NAD, AAOx3  HE/ENT: extraocular movements grossly intact, moist mucous membranes CV: RRR PULM: nl respiratory effort, CTABL Abd: gravid, non-tender, non-distended, soft  Ext: Non-tender, Nonedmeatous Psych: mood appropriate, speech normal SVE: Dilation: 4 (dynamic cervix) Effacement (%): 50 Cervical Position: Posterior Station: -2 Presentation: Vertex Exam by:: Milon Dethloff CNM  -No cervical change  over 2 hours   Fetal Monitor: Baseline: 140 bpm Variability: moderate Accels: Present Decels: none Toco: Irregular, mild contractions   Category: I   Assessment: 30 y.o. [redacted]w[redacted]d here for antenatal surveillance during pregnancy.  Principle diagnosis: Uterine contractions in pregnancy    Plan: Labor: not present.  Fetal Wellbeing: Reassuring Cat 1 tracing. Reactive NST Cervix  initially felt 2cm dilated but is soft and stretchy from 2-4cm. Unchanged after 2hours.  Offered to monitor for additional 2-4 hours or discharge home.  Prefers to go home at this time.  Reviewed early labor comfort measures  D/c home stable, precautions reviewed, follow-up as scheduled.   ----- Margaretmary Eddy, CNM Certified Nurse Midwife [facility]

## 2020-11-28 NOTE — Progress Notes (Signed)
Discharge instructions provided to pt. Pt verbalizes understanding. Vaginal bleeding and discharge, contractions, and fetal movement reviewed by RN. Pt discharged home with significant other.  

## 2020-11-28 NOTE — OB Triage Note (Signed)
Amanda Fisher is a 30yo G3P1011, 38w 6d. She arrived to the unit with complaints of contractions every . She denies vaginal bleeding, reports positive fetal movement. Pain stated is a 3 on a pain scale of 0-10. VS stable, monitors applied and assessing.   Initial FHT 145 at 1924

## 2020-12-06 ENCOUNTER — Inpatient Hospital Stay: Admit: 2020-12-06 | Payer: Self-pay

## 2020-12-06 ENCOUNTER — Inpatient Hospital Stay: Payer: Medicaid Other | Admitting: Certified Registered Nurse Anesthetist

## 2020-12-06 ENCOUNTER — Encounter: Payer: Self-pay | Admitting: Obstetrics and Gynecology

## 2020-12-06 ENCOUNTER — Inpatient Hospital Stay
Admission: EM | Admit: 2020-12-06 | Discharge: 2020-12-07 | DRG: 806 | Disposition: A | Discharge status: Home / Self Care | Payer: Medicaid Other | Attending: Obstetrics and Gynecology | Admitting: Obstetrics and Gynecology

## 2020-12-06 ENCOUNTER — Other Ambulatory Visit: Payer: Self-pay

## 2020-12-06 DIAGNOSIS — J4520 Mild intermittent asthma, uncomplicated: Secondary | ICD-10-CM | POA: Diagnosis present

## 2020-12-06 DIAGNOSIS — F419 Anxiety disorder, unspecified: Secondary | ICD-10-CM | POA: Diagnosis present

## 2020-12-06 DIAGNOSIS — O9081 Anemia of the puerperium: Secondary | ICD-10-CM | POA: Diagnosis not present

## 2020-12-06 DIAGNOSIS — Z20822 Contact with and (suspected) exposure to covid-19: Secondary | ICD-10-CM | POA: Diagnosis present

## 2020-12-06 DIAGNOSIS — Z3A40 40 weeks gestation of pregnancy: Secondary | ICD-10-CM

## 2020-12-06 DIAGNOSIS — F329 Major depressive disorder, single episode, unspecified: Secondary | ICD-10-CM | POA: Diagnosis present

## 2020-12-06 DIAGNOSIS — O9952 Diseases of the respiratory system complicating childbirth: Secondary | ICD-10-CM | POA: Diagnosis present

## 2020-12-06 DIAGNOSIS — O26893 Other specified pregnancy related conditions, third trimester: Secondary | ICD-10-CM | POA: Diagnosis present

## 2020-12-06 DIAGNOSIS — O99214 Obesity complicating childbirth: Secondary | ICD-10-CM | POA: Diagnosis present

## 2020-12-06 DIAGNOSIS — D62 Acute posthemorrhagic anemia: Secondary | ICD-10-CM | POA: Diagnosis not present

## 2020-12-06 DIAGNOSIS — O99344 Other mental disorders complicating childbirth: Secondary | ICD-10-CM | POA: Diagnosis present

## 2020-12-06 LAB — TYPE AND SCREEN
ABO/RH(D): A POS
Antibody Screen: NEGATIVE

## 2020-12-06 LAB — CBC
HCT: 36.1 % (ref 36.0–46.0)
Hemoglobin: 12.1 g/dL (ref 12.0–15.0)
MCH: 26.1 pg (ref 26.0–34.0)
MCHC: 33.5 g/dL (ref 30.0–36.0)
MCV: 78 fL — ABNORMAL LOW (ref 80.0–100.0)
Platelets: 175 10*3/uL (ref 150–400)
RBC: 4.63 MIL/uL (ref 3.87–5.11)
RDW: 15.2 % (ref 11.5–15.5)
WBC: 10.4 10*3/uL (ref 4.0–10.5)
nRBC: 0 % (ref 0.0–0.2)

## 2020-12-06 LAB — RESP PANEL BY RT-PCR (FLU A&B, COVID) ARPGX2
Influenza A by PCR: NEGATIVE
Influenza B by PCR: NEGATIVE
SARS Coronavirus 2 by RT PCR: NEGATIVE

## 2020-12-06 MED ORDER — PHENYLEPHRINE 40 MCG/ML (10ML) SYRINGE FOR IV PUSH (FOR BLOOD PRESSURE SUPPORT)
80.0000 ug | PREFILLED_SYRINGE | INTRAVENOUS | Status: DC | PRN
Start: 2020-12-06 — End: 2020-12-06
  Filled 2020-12-06: qty 10

## 2020-12-06 MED ORDER — LACTATED RINGERS IV SOLN: INTRAVENOUS | Status: DC

## 2020-12-06 MED ORDER — DIBUCAINE (PERIANAL) 1 % EX OINT
1.0000 | TOPICAL_OINTMENT | CUTANEOUS | Status: DC | PRN
Start: 2020-12-06 — End: 2020-12-07

## 2020-12-06 MED ORDER — TERBUTALINE SULFATE 1 MG/ML IJ SOLN: 0.2500 mg | Freq: Once | INTRAMUSCULAR | Status: DC | PRN

## 2020-12-06 MED ORDER — DIPHENHYDRAMINE HCL 50 MG/ML IJ SOLN
12.5000 mg | INTRAMUSCULAR | Status: DC | PRN
Start: 2020-12-06 — End: 2020-12-06

## 2020-12-06 MED ORDER — ONDANSETRON HCL 4 MG/2ML IJ SOLN
4.0000 mg | INTRAMUSCULAR | Status: DC | PRN
Start: 2020-12-06 — End: 2020-12-07

## 2020-12-06 MED ORDER — IBUPROFEN 600 MG PO TABS
600.0000 mg | ORAL_TABLET | Freq: Four times a day (QID) | ORAL | Status: DC
Start: 2020-12-07 — End: 2020-12-07
  Administered 2020-12-06 – 2020-12-07 (×4): 600 mg via ORAL
  Filled 2020-12-06 (×3): qty 1

## 2020-12-06 MED ORDER — MONTELUKAST SODIUM 10 MG PO TABS
10.0000 mg | ORAL_TABLET | Freq: Every day | ORAL | Status: DC
  Filled 2020-12-06 (×2): qty 1

## 2020-12-06 MED ORDER — LIDOCAINE-EPINEPHRINE (PF) 1.5 %-1:200000 IJ SOLN
INTRAMUSCULAR | Status: DC | PRN
  Administered 2020-12-06: 3 mL via PERINEURAL

## 2020-12-06 MED ORDER — SOD CITRATE-CITRIC ACID 500-334 MG/5ML PO SOLN: 30.0000 mL | ORAL | Status: DC | PRN

## 2020-12-06 MED ORDER — ACETAMINOPHEN 325 MG PO TABS: 650.0000 mg | ORAL_TABLET | ORAL | Status: DC | PRN

## 2020-12-06 MED ORDER — LIDOCAINE HCL (PF) 1 % IJ SOLN
30.0000 mL | INTRAMUSCULAR | Status: DC | PRN
  Filled 2020-12-06: qty 30

## 2020-12-06 MED ORDER — PHENYLEPHRINE 40 MCG/ML (10ML) SYRINGE FOR IV PUSH (FOR BLOOD PRESSURE SUPPORT)
80.0000 ug | PREFILLED_SYRINGE | INTRAVENOUS | Status: DC | PRN
  Filled 2020-12-06: qty 10

## 2020-12-06 MED ORDER — FENTANYL CITRATE (PF) 100 MCG/2ML IJ SOLN: 50.0000 ug | INTRAMUSCULAR | Status: DC | PRN

## 2020-12-06 MED ORDER — ACETAMINOPHEN 325 MG PO TABS
650.0000 mg | ORAL_TABLET | ORAL | Status: DC | PRN
  Administered 2020-12-06: 650 mg via ORAL
  Filled 2020-12-06: qty 2

## 2020-12-06 MED ORDER — TETANUS-DIPHTH-ACELL PERTUSSIS 5-2.5-18.5 LF-MCG/0.5 IM SUSY: 0.5000 mL | PREFILLED_SYRINGE | Freq: Once | INTRAMUSCULAR | Status: DC

## 2020-12-06 MED ORDER — WITCH HAZEL-GLYCERIN EX PADS
1.0000 "application " | MEDICATED_PAD | CUTANEOUS | Status: DC | PRN
  Filled 2020-12-06: qty 100

## 2020-12-06 MED ORDER — OXYTOCIN 10 UNIT/ML IJ SOLN
INTRAMUSCULAR | Status: AC
  Filled 2020-12-06: qty 2

## 2020-12-06 MED ORDER — PRENATAL MULTIVITAMIN CH
1.0000 | ORAL_TABLET | Freq: Every day | ORAL | Status: DC
  Administered 2020-12-07: 1 via ORAL
  Filled 2020-12-06: qty 1

## 2020-12-06 MED ORDER — MEASLES, MUMPS & RUBELLA VAC IJ SOLR
0.5000 mL | Freq: Once | INTRAMUSCULAR | Status: DC
  Filled 2020-12-06: qty 0.5

## 2020-12-06 MED ORDER — MISOPROSTOL 200 MCG PO TABS
ORAL_TABLET | ORAL | Status: AC
  Filled 2020-12-06: qty 4

## 2020-12-06 MED ORDER — AMMONIA AROMATIC IN INHA
RESPIRATORY_TRACT | Status: AC
  Filled 2020-12-06: qty 10

## 2020-12-06 MED ORDER — OXYCODONE HCL 5 MG PO TABS: 5.0000 mg | ORAL_TABLET | ORAL | Status: DC | PRN

## 2020-12-06 MED ORDER — ONDANSETRON HCL 4 MG/2ML IJ SOLN: 4.0000 mg | Freq: Four times a day (QID) | INTRAMUSCULAR | Status: DC | PRN

## 2020-12-06 MED ORDER — LACTATED RINGERS IV SOLN: 500.0000 mL | Freq: Once | INTRAVENOUS | Status: DC

## 2020-12-06 MED ORDER — BUPIVACAINE HCL (PF) 0.25 % IJ SOLN
INTRAMUSCULAR | Status: DC | PRN
  Administered 2020-12-06: 4 mL via EPIDURAL
  Administered 2020-12-06: 2 mL via EPIDURAL

## 2020-12-06 MED ORDER — BENZOCAINE-MENTHOL 20-0.5 % EX AERO
1.0000 "application " | INHALATION_SPRAY | CUTANEOUS | Status: DC | PRN
  Filled 2020-12-06: qty 56

## 2020-12-06 MED ORDER — BISACODYL 10 MG RE SUPP: 10.0000 mg | Freq: Every day | RECTAL | Status: DC | PRN

## 2020-12-06 MED ORDER — SODIUM CHLORIDE 0.9% FLUSH: 3.0000 mL | Freq: Two times a day (BID) | INTRAVENOUS | Status: DC

## 2020-12-06 MED ORDER — SIMETHICONE 80 MG PO CHEW: 80.0000 mg | CHEWABLE_TABLET | ORAL | Status: DC | PRN

## 2020-12-06 MED ORDER — COCONUT OIL OIL
1.0000 "application " | TOPICAL_OIL | Status: DC | PRN
  Filled 2020-12-06: qty 120

## 2020-12-06 MED ORDER — ONDANSETRON HCL 4 MG PO TABS
4.0000 mg | ORAL_TABLET | ORAL | Status: DC | PRN
Start: 2020-12-06 — End: 2020-12-07

## 2020-12-06 MED ORDER — SODIUM CHLORIDE 0.9 % IV SOLN: 250.0000 mL | INTRAVENOUS | Status: DC | PRN

## 2020-12-06 MED ORDER — EPHEDRINE 5 MG/ML INJ
10.0000 mg | INTRAVENOUS | Status: DC | PRN
  Filled 2020-12-06: qty 2

## 2020-12-06 MED ORDER — SENNOSIDES-DOCUSATE SODIUM 8.6-50 MG PO TABS
2.0000 | ORAL_TABLET | ORAL | Status: DC
  Administered 2020-12-06: 2 via ORAL
  Filled 2020-12-06: qty 2

## 2020-12-06 MED ORDER — LORATADINE 10 MG PO TABS
10.0000 mg | ORAL_TABLET | Freq: Every day | ORAL | Status: DC
  Filled 2020-12-06: qty 1

## 2020-12-06 MED ORDER — LACTATED RINGERS IV SOLN
500.0000 mL | INTRAVENOUS | Status: DC | PRN
  Administered 2020-12-06: 500 mL via INTRAVENOUS

## 2020-12-06 MED ORDER — FENTANYL-BUPIVACAINE-NACL 0.5-0.125-0.9 MG/250ML-% EP SOLN: 12.0000 mL/h | EPIDURAL | Status: DC | PRN

## 2020-12-06 MED ORDER — LIDOCAINE HCL (PF) 1 % IJ SOLN
INTRAMUSCULAR | Status: DC | PRN
  Administered 2020-12-06: 2 mL

## 2020-12-06 MED ORDER — OXYTOCIN-SODIUM CHLORIDE 30-0.9 UT/500ML-% IV SOLN
1.0000 m[IU]/min | INTRAVENOUS | Status: DC
  Administered 2020-12-06: 2 m[IU]/min via INTRAVENOUS
  Filled 2020-12-06: qty 1000

## 2020-12-06 MED ORDER — SODIUM CHLORIDE 0.9% FLUSH: 3.0000 mL | INTRAVENOUS | Status: DC | PRN

## 2020-12-06 MED ORDER — FENTANYL-BUPIVACAINE-NACL 0.5-0.125-0.9 MG/250ML-% EP SOLN
EPIDURAL | Status: DC | PRN
  Administered 2020-12-06: 12 mL/h via EPIDURAL

## 2020-12-06 MED ORDER — FLEET ENEMA 7-19 GM/118ML RE ENEM: 1.0000 | ENEMA | Freq: Every day | RECTAL | Status: DC | PRN

## 2020-12-06 MED ORDER — LIDOCAINE HCL (PF) 1 % IJ SOLN
INTRAMUSCULAR | Status: AC
  Filled 2020-12-06: qty 30

## 2020-12-06 MED ORDER — FENTANYL-BUPIVACAINE-NACL 0.5-0.125-0.9 MG/250ML-% EP SOLN
EPIDURAL | Status: AC
  Filled 2020-12-06: qty 250

## 2020-12-06 MED ORDER — SERTRALINE HCL 100 MG PO TABS: 100.0000 mg | ORAL_TABLET | Freq: Every day | ORAL | Status: DC

## 2020-12-06 MED ORDER — DIPHENHYDRAMINE HCL 25 MG PO CAPS: 25.0000 mg | ORAL_CAPSULE | Freq: Four times a day (QID) | ORAL | Status: DC | PRN

## 2020-12-06 NOTE — Progress Notes (Signed)
Labor Progress Note  Amanda Fisher is a 30 y.o. G3P1011 at [redacted]w[redacted]d by ultrasound admitted for active labor  Subjective: She is resting comfortably after receiving epidural.  Objective: BP 130/87 (BP Location: Left Arm)   Pulse (!) 102   Temp 98.1 F (36.7 C) (Oral)   Resp 16   Ht 5\' 3"  (1.6 m)   Wt 81.6 kg   SpO2 99%   BMI 31.89 kg/m  Notable VS details: reviewed  Fetal Assessment: FHT:  FHR: 135 bpm, variability: moderate,  accelerations:  Present,  decelerations:  Present intermittent variable decels Category/reactivity:  Category II UC:   regular, every 5-7 minutes SVE:    Dilation: 8cm  Effacement: 80  Station:  0  Consistency: soft  Position: middle  Membrane status:AROM Amniotic color: clear  Labs: Lab Results  Component Value Date   WBC 10.4 12/06/2020   HGB 12.1 12/06/2020   HCT 36.1 12/06/2020   MCV 78.0 (L) 12/06/2020   PLT 175 12/06/2020    Assessment / Plan: Spontaneous labor, progressing normally  Labor: Progressing normally Fetal Wellbeing:  Category II Pain Control:  Epidural Anticipated MOD:  NSVD  02/05/2021 CNM Donato Schultz Latasha Puskas, CNM 12/06/2020, 2:57 PM

## 2020-12-06 NOTE — Anesthesia Preprocedure Evaluation (Signed)
Anesthesia Evaluation  Patient identified by MRN, date of birth, ID band Patient awake    Reviewed: Allergy & Precautions, NPO status , Patient's Chart, lab work & pertinent test results  Airway Mallampati: II   Neck ROM: full    Dental no notable dental hx.    Pulmonary asthma ,    Pulmonary exam normal        Cardiovascular negative cardio ROS Normal cardiovascular exam     Neuro/Psych PSYCHIATRIC DISORDERS Anxiety Depression    GI/Hepatic Neg liver ROS, GERD  Controlled and Medicated,  Endo/Other  negative endocrine ROS  Renal/GU negative Renal ROS  negative genitourinary   Musculoskeletal negative musculoskeletal ROS (+)   Abdominal   Peds negative pediatric ROS (+)  Hematology negative hematology ROS (+)   Anesthesia Other Findings   Reproductive/Obstetrics negative OB ROS                             Anesthesia Physical Anesthesia Plan  ASA: 2  Anesthesia Plan: Epidural   Post-op Pain Management:    Induction:   PONV Risk Score and Plan:   Airway Management Planned:   Additional Equipment:   Intra-op Plan:   Post-operative Plan:   Informed Consent: I have reviewed the patients History and Physical, chart, labs and discussed the procedure including the risks, benefits and alternatives for the proposed anesthesia with the patient or authorized representative who has indicated his/her understanding and acceptance.     Dental Advisory Given  Plan Discussed with: Anesthesiologist and CRNA  Anesthesia Plan Comments:         Anesthesia Quick Evaluation

## 2020-12-06 NOTE — Discharge Summary (Signed)
Obstetrical Discharge Summary  Patient Name: Amanda Fisher DOB: October 10, 1990 MRN: [id number]  Date of Admission: 12/06/2020 Date of Delivery: 12/06/2020 Delivered by: Dr. Dalbert Garnet Date of Discharge: 12/07/2020  Primary OB: [facility]  LMP:No LMP recorded. EDC Estimated Date of Delivery: 12/06/20 Gestational Age at Delivery: [redacted]w[redacted]d   Antepartum complications:  1. Major depressive episode and anxiety Hx of suicide attempt 2021 Takes Zoloft 100mg  daily Sees counselor every 2wks 2. Obesity in pregnancy - BMI 33 3. Asthma Mild, intermittent 4. Reactive RPR  11/15/2020 - RPR reactive, ratio 1:1  T. 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   Suicide attempt by drug ingestion (HCC) 08/07/2019   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   OCD (obsessive compulsive disorder) 08/07/2019   Diphenhydramine overdose 08/07/2019   Asthma 04/09/2019    Augmentation: Pitocin Complications: fetal bradycardia Intrapartum complications/course: vacuum delivery, see delivery note Date of Delivery: 12/06/2020 Delivered By: Dr/ 02/05/2021 Delivery Type: vacuum, low Anesthesia: epidural Placenta: spontaneous Laceration: none Episiotomy: none Newborn Data: Live born female  Birth Weight: 7 lb 0.2 oz (3180 g) APGAR: 7, 9  Newborn Delivery   Birth date/time: 12/06/2020 17:09:00 Delivery type: Vaginal, Vacuum (Extractor)        Postpartum Procedures: none  Edinburgh:  Edinburgh Postnatal Depression Scale Screening Tool 12/07/2020  I have been able to laugh and see the funny side of things. 0  I have looked forward with enjoyment to things. 0  I have blamed myself unnecessarily when things went wrong. 2  I  have been anxious or worried for no good reason. 2  I have felt scared or panicky for no good reason. 2  Things have been getting on top of me. 1  I have been so unhappy that I have had difficulty sleeping. 0  I have felt sad or miserable. 1  I have been so unhappy that I have been crying. 0  The thought of harming myself has occurred to me. 0  Edinburgh Postnatal Depression Scale Total 8      Post partum course:  Patient had an uncomplicated postpartum course.  By time of discharge on PPD#1, her pain was controlled on oral pain medications; she had appropriate lochia and was ambulating, voiding without difficulty and tolerating regular diet.  She was deemed stable for discharge to home.    Discharge Physical Exam:  BP 107/63 (BP Location: Right Arm)   Pulse 62   Temp 97.8 F (36.6 C) (Oral)   Resp 18   Ht 5\' 3"  (1.6 m)   Wt 81.6 kg   SpO2 99%   Breastfeeding Unknown   BMI 31.89 kg/m   General: NAD CV: RRR Pulm: CTABL, nl effort ABD: s/nd/nt, fundus firm and below the umbilicus Lochia: moderate Perineum: well approximated/intact DVT Evaluation: LE non-ttp, no evidence of DVT on exam.  Hemoglobin  Date Value Ref Range Status  12/07/2020 11.6 (L) 12.0 - 15.0 g/dL Final  02/06/2021 11.1 - 15.9 g/dL Final   HCT  Date Value Ref Range Status  12/07/2020 36.3 36.0 - 46.0 % Final   Hematocrit  Date Value Ref Range Status  04/21/2017 37.7 34.0 - 46.6 % Final     Disposition: stable, discharge to home. Baby Feeding: breastmilk Baby Disposition: home  with mom  Rh Immune globulin given: n/a Rubella vaccine given: immune Varicella vaccine given: immune Tdap vaccine given in AP or PP setting: declined Flu vaccine given in AP or PP setting: declined  Contraception: barrier  Prenatal Labs: Blood type/Rh A+  Antibody screen neg  Rubella Immune  Varicella Immune  RPR NR  HBsAg Neg  HIV NR  GC neg  Chlamydia neg  Genetic screening negative  1 hour GTT 102        GBS negative    Plan:  Lavena Loretto was discharged to home in good condition. Follow-up appointment with delivering provider in 6 weeks.  Discharge Medications: Allergies as of 12/07/2020       Reactions   Amoxicillin-pot Clavulanate Anaphylaxis   Promethazine Other (See Comments)   Shaking, restless legs Other reaction(s): Other (See Comments) Twitchy limbs  Twitchy limbs  Shaking, restless legs Shaking, restless legs   Other    SEASONAL   Tamiflu [oseltamivir] Other (See Comments)   depression   Fluoxetine Rash        Medication List     TAKE these medications    acetaminophen 325 MG tablet Commonly known as: Tylenol Take 2 tablets (650 mg total) by mouth every 4 (four) hours as needed (for pain scale < 4).   AeroChamber MV inhaler Use as instructed   benzocaine-Menthol 20-0.5 % Aero Commonly known as: DERMOPLAST Apply 1 application topically as needed for irritation (perineal discomfort).   coconut oil Oil Apply 1 application topically as needed.   desloratadine 5 MG tablet Commonly known as: CLARINEX Take 1 tablet (5 mg total) by mouth daily.   diphenhydrAMINE 25 mg capsule Commonly known as: BENADRYL Take 1 capsule (25 mg total) by mouth every 6 (six) hours as needed for itching.   ibuprofen 600 MG tablet Commonly known as: ADVIL Take 1 tablet (600 mg total) by mouth every 6 (six) hours.   montelukast 10 MG tablet Commonly known as: SINGULAIR Take 10 mg by mouth daily.   prenatal multivitamin Tabs tablet Take 1 tablet by mouth daily at 12 noon.   senna-docusate 8.6-50 MG tablet Commonly known as: Senokot-S Take 2 tablets by mouth daily.   sertraline 100 MG tablet Commonly known as: ZOLOFT Take 1 tablet (100 mg total) by mouth daily. What changed:  medication strength how much to take   traZODone 50 MG tablet Commonly known as: DESYREL Take 1 tablet (50 mg total) by mouth at bedtime.   witch hazel-glycerin pad Commonly known  as: TUCKS Apply 1 application topically as needed for hemorrhoids (for pain).         Follow-up Information     Janyce Llanos, CNM Follow up in 6 week(s).   Specialty: Certified Nurse Midwife Contact information: [address] [place] [address] [phone]                 Signed:  Randa Ngo, CNM 12/07/2020  2:51 PM

## 2020-12-06 NOTE — Lactation Note (Signed)
This note was copied from a baby's chart. Lactation Consultation Note  Patient Name: Amanda Fisher [id number] Date: 12/06/2020 Reason for consult: L&D Initial assessment;Term Age:28 hours  Maternal Data Does the patient have breastfeeding experience prior to this delivery?: Yes How long did the patient breastfeed?: 11.5 mths  Feeding Mother's Current Feeding Choice: Breast Milk Baby already latched and nursing when room entered, transition nurse assisting, in left cradle hold, noted wide open mouth at breast, deep latch. LATCH Score Latch: Repeated attempts needed to sustain latch, nipple held in mouth throughout feeding, stimulation needed to elicit sucking reflex.  Audible Swallowing: A few with stimulation  Type of Nipple: Everted at rest and after stimulation  Comfort (Breast/Nipple): Soft / non-tender  Hold (Positioning): Assistance needed to correctly position infant at breast and maintain latch.  LATCH Score: 7   Lactation Tools Discussed/Used    Interventions Interventions: Skin to skin;Assisted with latch;Support pillows  Discharge    Consult Status Consult Status: PRN    Dyann Kief 12/06/2020, 6:26 PM

## 2020-12-06 NOTE — OB Triage Note (Signed)
Pt presents c/o ctx that started around 4am. Pt states ctx were about 3.5 minutes apart lasting 60-90 secs. Pt denies bleeding or LOF. Pt reports positive fetal movement. VSS. Will continue to monitor.

## 2020-12-06 NOTE — Progress Notes (Signed)
Labor Progress Note  Amanda Fisher is a 30 y.o. G3P1011 at [redacted]w[redacted]d by ultrasound admitted for active labor  Subjective: resting comfortably with epidural  Objective: BP 130/87 (BP Location: Left Arm)   Pulse (!) 102   Temp 98.1 F (36.7 C) (Oral)   Resp 16   Ht 5\' 3"  (1.6 m)   Wt 81.6 kg   SpO2 99%   BMI 31.89 kg/m  Notable VS details: reviewed  Fetal Assessment: FHT:  FHR: 120 bpm, variability: moderate,  accelerations:  Present,  decelerations:  Present intermittent variable decels Category/reactivity:  Category II UC:   irregular, every 5-8 minutes Amniotic color: clear  Labs: Lab Results  Component Value Date   WBC 10.4 12/06/2020   HGB 12.1 12/06/2020   HCT 36.1 12/06/2020   MCV 78.0 (L) 12/06/2020   PLT 175 12/06/2020    Assessment / Plan: Augment labor with pitocin  Labor:  will start augmentation with pitocin Preeclampsia:  no signs or symptoms of toxicity Fetal Wellbeing:  Category II Pain Control:  Epidural Anticipated MOD:  NSVD  02/05/2021, CNM Donato Schultz, CNM 12/06/2020, 3:39 PM

## 2020-12-06 NOTE — H&P (Signed)
OB History & Physical   History of Present Illness:  Chief Complaint: labor contractions  HPI:  Amanda Fisher is a 30 y.o. G46P1011 female at [redacted]w[redacted]d dated by ultrasound.  She presents to L&D for contractions since 0400 that are getting progressively stronger and closer together  Active FM onset of ctx @ 0400 currently every 6 minutes No leaking fluid bloody show on Monday Desire an epidural   Pregnancy Issues: 1. Major depressive episode and anxiety Hx of suicide attempt 2021 Takes Zoloft 100mg  daily Sees counselor every 2wks 2. Obesity in pregnancy - BMI 33 3. Asthma Mild, intermittent 4. Reactive RPR  11/15/2020 - RPR reactive, ratio 1:1  T. Pallidium Non-reactive    Maternal Medical History:   Past Medical History:  Diagnosis Date   Acid reflux    Asthma    Depression    Hypoglycemia     Past Surgical History:  Procedure Laterality Date   TONSILLECTOMY AND ADENOIDECTOMY     WISDOM TOOTH EXTRACTION  2013    Allergies  Allergen Reactions   Amoxicillin-Pot Clavulanate Anaphylaxis   Promethazine Other (See Comments)    Shaking, restless legs Other reaction(s): Other (See Comments) Twitchy limbs  Twitchy limbs  Shaking, restless legs Shaking, restless legs   Other     SEASONAL   Tamiflu [Oseltamivir] Other (See Comments)    depression   Fluoxetine Rash    Prior to Admission medications   Medication Sig Start Date End Date Taking? Authorizing Provider  sertraline (ZOLOFT) 50 MG tablet Take 1 tablet (50 mg total) by mouth daily. 08/10/19  Yes Clapacs, 10/10/19, MD  desloratadine (CLARINEX) 5 MG tablet Take 1 tablet (5 mg total) by mouth daily. 09/23/19   Lamptey, 09/25/19, MD  montelukast (SINGULAIR) 10 MG tablet Take 10 mg by mouth daily. 08/11/19   [provider]  Spacer/Aero-Holding Chambers (AEROCHAMBER MV) inhaler Use as instructed 08/23/20   08/25/20, NP  traZODone (DESYREL) 50 MG tablet Take 1 tablet (50 mg total) by mouth at bedtime. 08/09/19    Clapacs, 10/09/19, MD     Prenatal care site: [facility]   Social History: She  reports that she has never smoked. She has never used smokeless tobacco. She reports that she does not currently use alcohol. She reports that she does not use drugs.  Family History: family history includes Cancer (age of onset: 85) in her mother; Cancer (age of onset: 50) in her father; Cancer (age of onset: 74) in her paternal grandmother; Hypertension in her father and mother; Thyroid disease in her mother.   Review of Systems: A full review of systems was performed and negative except as noted in the HPI.     Physical Exam:  Vital Signs: BP 130/87 (BP Location: Left Arm)   Pulse (!) 102   Temp 98.1 F (36.7 C) (Oral)   Resp 16   Ht 5\' 3"  (1.6 m)   Wt 81.6 kg   SpO2 99%   BMI 31.89 kg/m  General: no acute distress.  HEENT: normocephalic, atraumatic Heart: regular rate & rhythm.  No murmurs/rubs/gallops Lungs: clear to auscultation bilaterally, normal respiratory effort Abdomen: soft, gravid, non-tender;  EFW: 7lb Pelvic:   External: Normal external female genitalia  Cervix: Dilation: 5.5 / Effacement (%): 70 / Station: 0    Extremities: non-tender, symmetric, no edema bilaterally.  DTRs: negative  Neurologic: Alert & oriented x 3.    No results found for this or any previous visit (from the  past 24 hour(s)).  Pertinent Results:  Prenatal Labs: Blood type/Rh A+  Antibody screen neg  Rubella Immune  Varicella Immune  RPR NR  HBsAg Neg  HIV NR  GC neg  Chlamydia neg  Genetic screening negative  1 hour GTT 102     GBS negative   FHT: 135, moderate variability, accels present, no decels, reactive, category I TOCO: contractions q5-22min SVE:  Dilation: 5.5 / Effacement (%): 70 / Station: 0    Cephalic by leopolds  No results found.  Assessment:  Amanda Fisher is a 30 y.o. G79P1011 female at [redacted]w[redacted]d with early labor.   Plan:  1. Admit to Labor & Delivery; consents  reviewed and obtained  2. Fetal Well being  - Fetal Tracing: Category I - Group B Streptococcus ppx indicated: negative, no need for abx - Presentation: cephalic confirmed by SVE   3. Routine OB: - Prenatal labs reviewed, as above - Rh + - CBC, T&S, RPR on admit - Clear fluids, IVF  4. Monitoring of Labor -  Contractions q5-29min, external toco in place -  Pelvis proven to 3.6kg -  Plan for continuous fetal monitoring  -  Maternal pain control as desired; requesting regional anesthesia - Anticipate vaginal delivery  5. Post Partum Planning: - Infant feeding: breastfeeding - Contraception: TBD  Donato Schultz CNM Randa Ngo, CNM 12/06/20 11:08 AM

## 2020-12-06 NOTE — Progress Notes (Signed)
To room for SVE, C/C/+1. Noted asynclitic and OP, maternal effort fair with slow fetal descent. FHR tracing with prolonged decels to 70bpm, then brief recovery to 110bpm. Maternal position changed, FHR again to 70bpm without recovery.  Counseled pt regarding vacuum assist due to fetal bradycardia, flat kiwi placed between fontanelles, one pull with next UC and popoff occurred. Replaced vacuum and again pulled, lost suction and  popoff occurred. Dr Dalbert Garnet requested to room.   Randa Ngo, CNM 12/06/2020 5:44 PM

## 2020-12-06 NOTE — Anesthesia Procedure Notes (Addendum)
Epidural Patient location during procedure: OB Start time: 12/06/2020 2:05 PM End time: 12/06/2020 2:10 PM  Staffing Anesthesiologist: Corinda Gubler, MD Resident/CRNA: Hezzie Bump, CRNA Performed: resident/CRNA   Preanesthetic Checklist Completed: patient identified, IV checked, site marked, risks and benefits discussed, surgical consent, monitors and equipment checked, pre-op evaluation and timeout performed  Epidural Patient position: sitting Prep: ChloraPrep Patient monitoring: heart rate, continuous pulse ox and blood pressure Approach: midline Location: L3-L4 Injection technique: LOR saline  Needle:  Needle type: Tuohy  Needle gauge: 17 G Needle length: 9 cm and 9 Needle insertion depth: 6.5 cm Catheter type: closed end flexible Catheter size: 19 Gauge Catheter at skin depth: 11 cm Test dose: negative and 1.5% lidocaine with Epi 1:200 K  Assessment Sensory level: T10 Events: blood not aspirated, injection not painful, no injection resistance, no paresthesia and negative IV test  Additional Notes 1 attempt Pt. Evaluated and documentation done after procedure finished. Patient identified. Risks/Benefits/Options discussed with patient including but not limited to bleeding, infection, nerve damage, paralysis, failed block, incomplete pain control, headache, blood pressure changes, nausea, vomiting, reactions to medication both or allergic, itching and postpartum back pain. Confirmed with bedside nurse the patient's most recent platelet count. Confirmed with patient that they are not currently taking any anticoagulation, have any bleeding history or any family history of bleeding disorders. Patient expressed understanding and wished to proceed. All questions were answered. Sterile technique was used throughout the entire procedure. Please see nursing notes for vital signs. Test dose was given through epidural catheter and negative prior to continuing to dose epidural or start  infusion. Warning signs of high block given to the patient including shortness of breath, tingling/numbness in hands, complete motor block, or any concerning symptoms with instructions to call for help. Patient was given instructions on fall risk and not to get out of bed. All questions and concerns addressed with instructions to call with any issues or inadequate analgesia.    Patient tolerated the insertion well without immediate complications.Reason for block:procedure for pain

## 2020-12-07 LAB — RPR: RPR Ser Ql: NONREACTIVE

## 2020-12-07 LAB — CBC
HCT: 36.3 % (ref 36.0–46.0)
Hemoglobin: 11.6 g/dL — ABNORMAL LOW (ref 12.0–15.0)
MCH: 25.2 pg — ABNORMAL LOW (ref 26.0–34.0)
MCHC: 32 g/dL (ref 30.0–36.0)
MCV: 78.9 fL — ABNORMAL LOW (ref 80.0–100.0)
Platelets: 164 10*3/uL (ref 150–400)
RBC: 4.6 MIL/uL (ref 3.87–5.11)
RDW: 15.3 % (ref 11.5–15.5)
WBC: 13.2 10*3/uL — ABNORMAL HIGH (ref 4.0–10.5)
nRBC: 0 % (ref 0.0–0.2)

## 2020-12-07 MED ORDER — ACETAMINOPHEN 325 MG PO TABS: 650.0000 mg | ORAL_TABLET | ORAL | Status: DC | PRN

## 2020-12-07 MED ORDER — PRENATAL MULTIVITAMIN CH: 1.0000 | ORAL_TABLET | Freq: Every day | ORAL | Status: AC

## 2020-12-07 MED ORDER — DIPHENHYDRAMINE HCL 25 MG PO CAPS: 25.0000 mg | ORAL_CAPSULE | Freq: Four times a day (QID) | ORAL | 0 refills | Status: DC | PRN

## 2020-12-07 MED ORDER — SERTRALINE HCL 100 MG PO TABS: 100.0000 mg | ORAL_TABLET | Freq: Every day | ORAL | Status: DC

## 2020-12-07 MED ORDER — IBUPROFEN 600 MG PO TABS: 600.0000 mg | ORAL_TABLET | Freq: Four times a day (QID) | ORAL | 0 refills | Status: DC

## 2020-12-07 MED ORDER — SENNOSIDES-DOCUSATE SODIUM 8.6-50 MG PO TABS: 2.0000 | ORAL_TABLET | ORAL | 0 refills | Status: DC

## 2020-12-07 MED ORDER — COCONUT OIL OIL: 1.0000 "application " | TOPICAL_OIL | 0 refills | Status: DC | PRN

## 2020-12-07 MED ORDER — BENZOCAINE-MENTHOL 20-0.5 % EX AERO: 1.0000 "application " | INHALATION_SPRAY | CUTANEOUS | Status: DC | PRN

## 2020-12-07 MED ORDER — SERTRALINE HCL 100 MG PO TABS: 100.0000 mg | ORAL_TABLET | Freq: Every day | ORAL | 0 refills | Status: AC

## 2020-12-07 MED ORDER — WITCH HAZEL-GLYCERIN EX PADS: 1.0000 "application " | MEDICATED_PAD | CUTANEOUS | 12 refills | Status: DC | PRN

## 2020-12-07 NOTE — Anesthesia Postprocedure Evaluation (Signed)
Anesthesia Post Note  Patient: Amanda Fisher  Procedure(s) Performed: AN AD HOC LABOR EPIDURAL  Patient location during evaluation: Mother Baby Anesthesia Type: Epidural Level of consciousness: awake and alert Pain management: pain level controlled Vital Signs Assessment: post-procedure vital signs reviewed and stable Respiratory status: spontaneous breathing, nonlabored ventilation and respiratory function stable Cardiovascular status: stable Postop Assessment: no headache, no backache and epidural receding Anesthetic complications: no   No notable events documented.   Last Vitals:  Vitals:   12/07/20 0307 12/07/20 0735  BP: 102/60 115/74  Pulse: (!) 46 61  Resp:  20  Temp:  36.7 C  SpO2: 97% 98%    Last Pain:  Vitals:   12/07/20 0900  TempSrc:   PainSc: 2                  Corinda Gubler

## 2020-12-07 NOTE — Progress Notes (Signed)
Patient discharged with infant. Discharge instructions, prescriptions, and follow up appointments given to and reviewed with patient. Patient verbalized understanding. Will be escorted out by axillary.  °

## 2020-12-07 NOTE — Progress Notes (Signed)
Post Partum Day 1 Subjective: Doing well, no complaints.  Tolerating regular diet, pain with PO meds, voiding and ambulating without difficulty.  No CP SOB Fever,Chills, N/V or leg pain; denies nipple or breast pain, no HA change of vision, RUQ/epigastric pain  Objective: BP 115/74 (BP Location: Right Arm)   Pulse 61   Temp 98.1 F (36.7 C) (Oral)   Resp 20   Ht 5\' 3"  (1.6 m)   Wt 81.6 kg   SpO2 98% Comment: Room Air  Breastfeeding Unknown   BMI 31.89 kg/m    Physical Exam:  General: NAD Breasts: soft/nontender CV: RRR Pulm: nl effort, CTABL Abdomen: soft, NT, BS x 4 Perineum: minimal edema, intact Lochia: small Uterine Fundus: fundus firm and 1 fb below umbilicus DVT Evaluation: no cords, ttp LEs   Recent Labs    12/06/20 1259 12/07/20 0708  HGB 12.1 11.6*  HCT 36.1 36.3  WBC 10.4 13.2*  PLT 175 164    Assessment/Plan: 30 y.o. 02/06/21 postpartum day # 1  - Continue routine PP care - Lactation consult   - Discussed contraceptive options including implant, IUDs hormonal and non-hormonal, injection, pills/ring/patch, condoms, and NFP.  - Acute blood loss anemia - hemodynamically stable and asymptomatic; start po ferrous sulfate BID with stool softeners  - Immunization status: all Imms up to date    Disposition: Does desire Dc home today.     [id number], CNM 12/07/2020  9:42 AM

## 2020-12-13 ENCOUNTER — Telehealth: Payer: Self-pay

## 2021-09-22 ENCOUNTER — Ambulatory Visit
Admission: RE | Admit: 2021-09-22 | Discharge: 2021-09-22 | Disposition: A | Discharge status: Home / Self Care | Payer: Medicaid Other | Source: Ambulatory Visit | Attending: Family Medicine | Admitting: Family Medicine

## 2021-09-22 VITALS — BP 144/75 | HR 64 | Temp 98.2°F | Resp 16

## 2021-09-22 DIAGNOSIS — K047 Periapical abscess without sinus: Secondary | ICD-10-CM

## 2021-09-22 MED ORDER — SULFAMETHOXAZOLE-TRIMETHOPRIM 800-160 MG PO TABS: 1.0000 | ORAL_TABLET | Freq: Two times a day (BID) | ORAL | 0 refills | Status: DC

## 2021-09-22 MED ORDER — CHLORHEXIDINE GLUCONATE 0.12 % MT SOLN: 15.0000 mL | Freq: Two times a day (BID) | OROMUCOSAL | 0 refills | Status: DC

## 2021-09-22 NOTE — ED Provider Notes (Signed)
[facility]    CSN: [id number] Arrival date & time: 09/22/21  1759      History   Chief Complaint Chief Complaint  Patient presents with   Abscess    I went to the dental school at [place] on 6/13 to get a root canal, the pain in my tooth and jaw is excruciating. My lymph nodes are sore to touch. - Entered by patient    HPI Amanda Fisher is a 31 y.o. female.   HPI Presents today for evaluation of a possible dental infection.  On 09/16/21 patient had a root canal and reports a few days after the procedure developing excruciating pain in the lower right jaw and pain is felt deep within the lower gum. She now has some mild facial swelling at the lower jaw which is tender with palpation. She has been taking ibuprofen without any relief.  She did contact [facility] and they are trying to work her in for follow up.  She is afebrile.  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   Suicide attempt by drug ingestion ([place]) 08/07/2019   Severe recurrent major depression without psychotic features ([place]) 08/07/2019   OCD (obsessive compulsive disorder) 08/07/2019   Diphenhydramine overdose 08/07/2019   Asthma 04/09/2019    Past Surgical History:  Procedure Laterality Date   TONSILLECTOMY AND ADENOIDECTOMY     WISDOM TOOTH EXTRACTION  2013    OB History     Gravida  3   Para  2   Term  2   Preterm      AB  1   Living  2      SAB  1   IAB      Ectopic      Multiple  0   Live Births  2            Home Medications    Prior to Admission medications   Medication Sig Start Date End Date Taking? Authorizing Provider  chlorhexidine (PERIDEX) 0.12 % solution Use  as directed 15 mLs in the mouth or throat 2 (two) times daily. Gargle and Spit 09/22/21  Yes Scot Jun, FNP  desloratadine (CLARINEX) 5 MG tablet Take 1 tablet (5 mg total) by mouth daily. 09/23/19  Yes Lamptey, Myrene Galas, MD  diphenhydrAMINE (BENADRYL) 25 mg capsule Take 1 capsule (25 mg total) by mouth every 6 (six) hours as needed for itching. 12/07/20  Yes McVey, Murray Hodgkins, CNM  ibuprofen (ADVIL) 600 MG tablet Take 1 tablet (600 mg total) by mouth every 6 (six) hours. 12/07/20  Yes McVey, Murray Hodgkins, CNM  montelukast (SINGULAIR) 10 MG tablet Take 10 mg by mouth daily. 08/11/19  Yes [provider]  Prenatal Vit-Fe Fumarate-FA (PRENATAL MULTIVITAMIN) TABS tablet Take 1 tablet by mouth daily at 12 noon. 12/07/20  Yes McVey, Murray Hodgkins, CNM  sertraline (ZOLOFT) 100 MG tablet Take 1 tablet (100 mg total) by mouth daily. 12/07/20  Yes McVey, Murray Hodgkins, CNM  Spacer/Aero-Holding Chambers (AEROCHAMBER MV) inhaler Use as instructed 08/23/20  Yes Margarette Canada, NP  sulfamethoxazole-trimethoprim (BACTRIM DS) 800-160 MG tablet Take 1 tablet by mouth 2 (two) times  daily. 09/22/21  Yes Bing Neighbors, FNP  traZODone (DESYREL) 50 MG tablet Take 1 tablet (50 mg total) by mouth at bedtime. 08/09/19  Yes Clapacs, Jackquline Denmark, MD  acetaminophen (TYLENOL) 325 MG tablet Take 2 tablets (650 mg total) by mouth every 4 (four) hours as needed (for pain scale < 4). 12/07/20   McVey, Prudencio Pair, CNM  benzocaine-Menthol (DERMOPLAST) 20-0.5 % AERO Apply 1 application topically as needed for irritation (perineal discomfort). 12/07/20   McVey, Prudencio Pair, CNM  coconut oil OIL Apply 1 application topically as needed. 12/07/20   McVey, Prudencio Pair, CNM  senna-docusate (SENOKOT-S) 8.6-50 MG tablet Take 2 tablets by mouth daily. 12/07/20   McVey, Prudencio Pair, CNM  witch hazel-glycerin (TUCKS) pad Apply 1 application topically as needed for hemorrhoids (for pain). 12/07/20   McVey, Prudencio Pair, CNM    Family History Family History  Problem Relation  Age of Onset   Cancer Mother 22       thyroid   Thyroid disease Mother    Hypertension Mother    Cancer Father 78       appendix - ca caused them to rupture   Hypertension Father    Cancer Paternal Grandmother 79       cervical    Social History Social History   Tobacco Use   Smoking status: Never   Smokeless tobacco: Never  Vaping Use   Vaping Use: Former  Substance Use Topics   Alcohol use: Not Currently    Comment: occasionally   Drug use: No     Allergies   Amoxicillin-pot clavulanate, Promethazine, Other, Tamiflu [oseltamivir], and Fluoxetine   Review of Systems Review of Systems Pertinent negatives listed in HPI   Physical Exam Triage Vital Signs ED Triage Vitals [09/22/21 1806]  Enc Vitals Group     BP (!) 144/75     Pulse Rate 64     Resp 16     Temp 98.2 F (36.8 C)     Temp Source Oral     SpO2 96 %     Weight      Height      Head Circumference      Peak Flow      Pain Score 7     Pain Loc      Pain Edu?      Excl. in GC?    No data found.  Updated Vital Signs BP (!) 144/75 (BP Location: Left Arm)   Pulse 64   Temp 98.2 F (36.8 C) (Oral)   Resp 16   SpO2 96%   Breastfeeding Yes   Visual Acuity Right Eye Distance:   Left Eye Distance:   Bilateral Distance:    Right Eye Near:   Left Eye Near:    Bilateral Near:     Physical Exam Vitals reviewed.  Constitutional:      Appearance: Normal appearance.  HENT:     Head:      Comments: No visible abscess in oral cavity Cardiovascular:     Rate and Rhythm: Normal rate and regular rhythm.  Pulmonary:     Effort: Pulmonary effort is normal.     Breath sounds: Normal breath sounds.  Musculoskeletal:     Cervical back: Normal range of motion and neck supple.  Neurological:     Mental Status: She is alert.      UC Treatments / Results  Labs (all labs ordered are listed, but only abnormal results are displayed) Labs Reviewed -  No data to display  EKG   Radiology No  results found.  Procedures Procedures (including critical care time)  Medications Ordered in UC Medications - No data to display  Initial Impression / Assessment and Plan / UC Course  I have reviewed the triage vital signs and the nursing notes.  Pertinent labs & imaging results that were available during my care of the patient were reviewed by me and considered in my medical decision making (see chart for details).    Dental Infection Patient has multiple antibiotic sensitivities to antibiotics and she is breastfeeding further limiting antibiotic therapy, therefore Bactrim was the safest option. Peridex prescribed for an oral rinse. Follow-up with dental provider. ER if facial swelling worsens. Final Clinical Impressions(s) / UC Diagnoses   Final diagnoses:  Dental infection   Discharge Instructions   None    ED Prescriptions     Medication Sig Dispense Auth. Provider   sulfamethoxazole-trimethoprim (BACTRIM DS) 800-160 MG tablet Take 1 tablet by mouth 2 (two) times daily. 20 tablet Bing Neighbors, FNP   chlorhexidine (PERIDEX) 0.12 % solution Use as directed 15 mLs in the mouth or throat 2 (two) times daily. Gargle and Spit 120 mL Bing Neighbors, FNP      PDMP not reviewed this encounter.   Bing Neighbors, FNP 09/23/21 [phone]

## 2021-09-22 NOTE — ED Triage Notes (Signed)
Pt presents with right side tooth pain she feels like she has an abscess in her mouth. She had a root canal on 09/16/21 at [facility]

## 2021-09-27 ENCOUNTER — Ambulatory Visit
Admission: RE | Admit: 2021-09-27 | Discharge: 2021-09-27 | Disposition: A | Discharge status: Home / Self Care | Payer: Medicaid Other | Source: Ambulatory Visit | Attending: Family Medicine | Admitting: Family Medicine

## 2021-09-27 VITALS — BP 112/72 | HR 80 | Temp 98.0°F | Resp 18

## 2021-09-27 DIAGNOSIS — R21 Rash and other nonspecific skin eruption: Secondary | ICD-10-CM | POA: Diagnosis not present

## 2021-09-27 DIAGNOSIS — L209 Atopic dermatitis, unspecified: Secondary | ICD-10-CM

## 2021-09-27 MED ORDER — LORATADINE 10 MG PO TABS: 10.0000 mg | ORAL_TABLET | Freq: Every day | ORAL | 0 refills | Status: DC | PRN

## 2021-09-27 MED ORDER — TRIAMCINOLONE ACETONIDE 0.025 % EX CREA: 1.0000 | TOPICAL_CREAM | Freq: Two times a day (BID) | CUTANEOUS | 0 refills | Status: DC

## 2021-09-27 NOTE — ED Provider Notes (Signed)
Amanda Fisher    CSN: [id number] Arrival date & time: 09/27/21  1410      History   Chief Complaint Chief Complaint  Patient presents with   Rash    HPI Amanda Fisher is a 31 y.o. female.   HPI Patient presents for evaluation of rash on bilateral arms x 1 week. Patient was seen here on 09/22/2021, treated for dental infection with Bactrim and Peridex. She endorses that the rash is itchy and was present on her bilateral arms prior to starting the Bactrim.  She reports that the rash began to precipitate and worsen therefore she discontinued the Bactrim yesterday.  Past Medical History:  Diagnosis Date   Acid reflux    Asthma    Depression    Hypoglycemia     Patient Active Problem List   Diagnosis Date Noted   Labor and delivery, indication for care 12/06/2020   Uterine contractions during pregnancy 11/28/2020   Supervision of normal pregnancy 05/07/2020   Drug psychosis ([facility]) 12/02/2019   Suicide attempt by drug ingestion ([facility]) 08/07/2019   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   OCD (obsessive compulsive disorder) 08/07/2019   Diphenhydramine overdose 08/07/2019   Asthma 04/09/2019    Past Surgical History:  Procedure Laterality Date   TONSILLECTOMY AND ADENOIDECTOMY     WISDOM TOOTH EXTRACTION  2013    OB History     Gravida  3   Para  2   Term  2   Preterm      AB  1   Living  2      SAB  1   IAB      Ectopic      Multiple  0   Live Births  2            Home Medications    Prior to Admission medications   Medication Sig Start Date End Date Taking? Authorizing Provider  loratadine (CLARITIN) 10 MG tablet Take 1 tablet (10 mg total) by mouth daily as needed for allergies. 09/27/21  Yes Bing Neighbors, FNP  triamcinolone (KENALOG) 0.025 % cream Apply 1 Application topically 2 (two) times daily. 09/27/21  Yes Bing Neighbors, FNP  acetaminophen (TYLENOL) 325 MG tablet Take 2  tablets (650 mg total) by mouth every 4 (four) hours as needed (for pain scale < 4). 12/07/20   McVey, Prudencio Pair, CNM  benzocaine-Menthol (DERMOPLAST) 20-0.5 % AERO Apply 1 application topically as needed for irritation (perineal discomfort). 12/07/20   McVey, Prudencio Pair, CNM  chlorhexidine (PERIDEX) 0.12 % solution Use as directed 15 mLs in the mouth or throat 2 (two) times daily. Gargle and Spit 09/22/21   Bing Neighbors, FNP  coconut oil OIL Apply 1 application topically as needed. 12/07/20   McVey, Prudencio Pair, CNM  desloratadine (CLARINEX) 5 MG tablet Take 1 tablet (5 mg total) by mouth daily. 09/23/19   Lamptey, Britta Mccreedy, MD  diphenhydrAMINE (BENADRYL) 25 mg capsule Take 1 capsule (25 mg total) by mouth every 6 (six) hours as needed for itching. 12/07/20   McVey, Prudencio Pair, CNM  ibuprofen (ADVIL) 600 MG tablet Take 1 tablet (600 mg total) by mouth every 6 (six) hours. 12/07/20   McVey, Prudencio Pair, CNM  montelukast (SINGULAIR) 10 MG tablet Take 10 mg by mouth daily. 08/11/19   [provider]  Prenatal Vit-Fe Fumarate-FA (PRENATAL MULTIVITAMIN) TABS tablet Take 1 tablet by mouth daily at 12 noon. 12/07/20   McVey,  Rebecca A, CNM  senna-docusate (SENOKOT-S) 8.6-50 MG tablet Take 2 tablets by mouth daily. 12/07/20   McVey, Prudencio Pair, CNM  sertraline (ZOLOFT) 100 MG tablet Take 1 tablet (100 mg total) by mouth daily. 12/07/20   McVey, Prudencio Pair, CNM  Spacer/Aero-Holding Chambers (AEROCHAMBER MV) inhaler Use as instructed 08/23/20   Becky Augusta, NP  sulfamethoxazole-trimethoprim (BACTRIM DS) 800-160 MG tablet Take 1 tablet by mouth 2 (two) times daily. 09/22/21   Bing Neighbors, FNP  traZODone (DESYREL) 50 MG tablet Take 1 tablet (50 mg total) by mouth at bedtime. 08/09/19   Clapacs, Jackquline Denmark, MD  witch hazel-glycerin (TUCKS) pad Apply 1 application topically as needed for hemorrhoids (for pain). 12/07/20   McVey, Prudencio Pair, CNM    Family History Family History  Problem Relation Age of Onset   Cancer Mother  17       thyroid   Thyroid disease Mother    Hypertension Mother    Cancer Father 58       appendix - ca caused them to rupture   Hypertension Father    Cancer Paternal Grandmother 74       cervical    Social History Social History   Tobacco Use   Smoking status: Never   Smokeless tobacco: Never  Vaping Use   Vaping Use: Former  Substance Use Topics   Alcohol use: Not Currently    Comment: occasionally   Drug use: No     Allergies   Amoxicillin-pot clavulanate, Promethazine, Other, Tamiflu [oseltamivir], and Fluoxetine   Review of Systems Review of Systems Pertinent negatives listed in HPI  Physical Exam Triage Vital Signs ED Triage Vitals  Enc Vitals Group     BP 09/27/21 1459 112/72     Pulse Rate 09/27/21 1457 80     Resp 09/27/21 1457 18     Temp 09/27/21 1457 98 F (36.7 C)     Temp Source 09/27/21 1457 Oral     SpO2 09/27/21 1457 96 %     Weight --      Height --      Head Circumference --      Peak Flow --      Pain Score 09/27/21 1454 0     Pain Loc --      Pain Edu? --      Excl. in GC? --    No data found.  Updated Vital Signs BP 112/72 (BP Location: Left Arm)   Pulse 80   Temp 98 F (36.7 C) (Oral)   Resp 18   SpO2 96%   Visual Acuity Right Eye Distance:   Left Eye Distance:   Bilateral Distance:    Right Eye Near:   Left Eye Near:    Bilateral Near:     Physical Exam General appearance: Alert, well developed, well nourished, cooperative  Head: Normocephalic, without obvious abnormality, atraumatic Heart: Rate and rhythm normal. No gallop or murmurs noted on exam  Respiratory: Respirations even and unlabored, normal respiratory rate Skin: generalized macular rash  Psych: Appropriate mood and affect. Neurologic: Mental status: Alert, oriented to person, place, and time, thought content appropriate.   UC Treatments / Results  Labs (all labs ordered are listed, but only abnormal results are displayed) Labs Reviewed - No data  to display  EKG   Radiology No results found.  Procedures Procedures (including critical care time)  Medications Ordered in UC Medications - No data to display  Initial Impression / Assessment and Plan /  UC Course  I have reviewed the triage vital signs and the nursing notes.  Pertinent labs & imaging results that were available during my care of the patient were reviewed by me and considered in my medical decision making (see chart for details).    Atopic dermatitis , unknown etiology Treatment with Triamcinolone Kenalog cream. Loratadine PRN daily for itching. RTC PRN  Final Clinical Impressions(s) / UC Diagnoses   Final diagnoses:  Atopic dermatitis and related condition     Discharge Instructions      Apply triamcinolone cream to affected rash up to twice daily as needed for rash.  Also recommend taking loratadine for minimum of 5 days until rash completely resolves.  This appears to be atopic dermatitis from an unknown source suspect outdoor irritant as a source.  I would recommend continuing the Peridex however do not resume the Bactrim unless recommended by your dentist.     ED Prescriptions     Medication Sig Dispense Auth. Provider   triamcinolone (KENALOG) 0.025 % cream Apply 1 Application topically 2 (two) times daily. 454 g Bing Neighbors, FNP   loratadine (CLARITIN) 10 MG tablet Take 1 tablet (10 mg total) by mouth daily as needed for allergies. 30 tablet Bing Neighbors, FNP      PDMP not reviewed this encounter.   Bing Neighbors, FNP 10/01/21 1958

## 2021-09-27 NOTE — ED Triage Notes (Signed)
Pt presents with a rash on bilateral arms x 1 week.

## 2022-04-06 NOTE — L&D Delivery Note (Addendum)
Delivery Note  Amanda Fisher is a [id number] at [redacted]w[redacted]d with an LMP of 05/21/22, inconsistent with [place] at [redacted]w[redacted]d.   First Stage: Labor onset: 2130 Analgesia /Anesthesia intrapartum:  none Date/time: 03/14/23 @ 2245 GBS:   Positive, untreated IP Antibiotics: abx: none  Second Stage: Complete dilation at 2245 Onset of pushing at 2250 FHR second stage unable to assess  [name] arrived at labor and delivery for advanced labor. She was at 10 centimeters dilated and +2 station, experiencing a natural urge to push. Despite this, she exerted minimal effort while pushing for approximately nine minutes. During this time, the patient declined to continue pushing or to change positions due to discomfort. Ultimately, she achieved a spontaneous vaginal birth, resulting in the delivery of a viable baby boy.on 03/14/2023 by CNM Delivery of fetal head in position: Occiput,, Anterior position with restitution to position: Left,, Occiput,, Transverse no nuchal cord;  Anterior then posterior shoulders delivered easily with gentle downward traction. Cord double clamped after 15 seconds, cut by  CNM  Baby handed off  and attended to by the  NICU team  Third Stage: Oxytocin bolus started after delivery of infant for hemorrhage prophylaxis  Placenta delivered schultz intact with 3 VC @ 2319 Placenta disposition: To Pathology: No  Uterine tone firm / exam; vaginal bleeding: minimal  Laceration: n/a laceration identified  Anesthesia for repair: procedures; anesthesia: none Repair suture type: n/a Est. Blood Loss (mL): 50  Complications:Diagnoses; OB-GYN delivery complications: precipitous delivery and terminal meconium noted at delivery  Mom to postpartum.  Baby to NICU.  Newborn: Information for the patient's newborn:  Librada, Bott [[id number]]  Live born female  Birth Weight:   APGAR: 3, 5   Newborn Delivery   Birth date/time: 03/14/2023 22:59:00 Delivery type: Vaginal, Spontaneous      Feeding planned:   TBD  ---------- Chari Manning, CNM Certified Nurse Midwife [facility]

## 2022-06-16 ENCOUNTER — Ambulatory Visit: Payer: Medicaid Other

## 2022-06-16 ENCOUNTER — Encounter: Payer: Self-pay | Admitting: Emergency Medicine

## 2022-06-16 ENCOUNTER — Ambulatory Visit (INDEPENDENT_AMBULATORY_CARE_PROVIDER_SITE_OTHER): Payer: Medicaid Other

## 2022-06-16 ENCOUNTER — Ambulatory Visit
Admission: EM | Admit: 2022-06-16 | Discharge: 2022-06-16 | Disposition: A | Discharge status: Home / Self Care | Payer: Medicaid Other | Attending: Emergency Medicine | Admitting: Emergency Medicine

## 2022-06-16 DIAGNOSIS — M25571 Pain in right ankle and joints of right foot: Secondary | ICD-10-CM

## 2022-06-16 DIAGNOSIS — Q682 Congenital deformity of knee: Secondary | ICD-10-CM | POA: Diagnosis not present

## 2022-06-16 NOTE — ED Triage Notes (Signed)
Pt c/o right ankle pain and right knee pain, swelling and bruised. She states she slipped going down a ramp about 3 days ago.

## 2022-06-16 NOTE — Discharge Instructions (Addendum)
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 than it should that can cause instability with movement, keep upcoming orthopedic appointment for further management and evaluation of this  You have been applied a ankle brace here in the office, you may use for stability and support as needed  You may take ibuprofen 600 to 800 mg every 6-8 hours and/or Tylenol 500 to 1000 mg every 6 hours as needed for management of pain  You may use ice or heat over the affected areas in 10 to 15-minute intervals  You may elevate when sitting and lying to help reduce swelling  You may follow-up with orthopedics for further evaluation and management as well as complete physical therapy to strengthen areas

## 2022-06-16 NOTE — ED Provider Notes (Signed)
[facility]    CSN: [id number] Arrival date & time: 06/16/22  1144      History   Chief Complaint Chief Complaint  Patient presents with   Ankle Pain    right    HPI Amanda Fisher is a 32 y.o. female.   Patient presents for evaluation of right ankle pain, swelling and ecchymosis beginning 3 days ago after fall.  Endorses that she slipped while walking on a ramp, falling diagonally and landing onto her right side heard a snapping sound.  Has had pain with completing range of motion but is able to bear weight, using crutches when independent at baseline.  Has not attempted treatment of symptoms.  Denies numbness or tingling.  Patient endorses a popping sensation present to the right knee for greater than 3 months, associated mild pain.  Denies injury or trauma, numbness or tingling.  Able to complete range of motion and bear weight.  First evaluation, has not attempted treatment.  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 Authorizing  Provider  sertraline (ZOLOFT) 100 MG tablet Take 1 tablet (100 mg total) by mouth daily. 12/07/20  Yes McVey, Murray Hodgkins, CNM  acetaminophen (TYLENOL) 325 MG tablet Take 2 tablets (650 mg total) by mouth every 4 (four) hours as needed (for pain scale < 4). 12/07/20   McVey, Murray Hodgkins, CNM  benzocaine-Menthol (DERMOPLAST) 20-0.5 % AERO Apply 1 application topically as needed for irritation (perineal discomfort). 12/07/20   McVey, Murray Hodgkins, CNM  chlorhexidine (PERIDEX) 0.12 % solution Use as directed 15 mLs in the mouth or throat 2 (two) times daily. Gargle and Spit 09/22/21   Scot Jun, NP  coconut oil OIL Apply 1 application topically as needed. 12/07/20   McVey, Murray Hodgkins, CNM  desloratadine (CLARINEX) 5 MG tablet Take 1 tablet (5 mg total) by mouth daily. 09/23/19   Lamptey, Myrene Galas, MD  diphenhydrAMINE (BENADRYL) 25 mg capsule Take 1 capsule (25 mg total) by mouth every 6 (six) hours as needed for itching. 12/07/20   McVey, Murray Hodgkins, CNM  ibuprofen (ADVIL) 600 MG tablet Take 1 tablet (600 mg total) by mouth every 6 (six) hours. 12/07/20   McVey, Murray Hodgkins, CNM  loratadine (CLARITIN) 10 MG tablet  Take 1 tablet (10 mg total) by mouth daily as needed for allergies. 09/27/21   Scot Jun, NP  montelukast (SINGULAIR) 10 MG tablet Take 10 mg by mouth daily. 08/11/19   [provider]  Prenatal Vit-Fe Fumarate-FA (PRENATAL MULTIVITAMIN) TABS tablet Take 1 tablet by mouth daily at 12 noon. 12/07/20   McVey, Murray Hodgkins, CNM  senna-docusate (SENOKOT-S) 8.6-50 MG tablet Take 2 tablets by mouth daily. 12/07/20   McVey, Murray Hodgkins, CNM  Spacer/Aero-Holding Chambers (AEROCHAMBER MV) inhaler Use as instructed 08/23/20   Margarette Canada, NP  sulfamethoxazole-trimethoprim (BACTRIM DS) 800-160 MG tablet Take 1 tablet by mouth 2 (two) times daily. 09/22/21   Scot Jun, NP  traZODone (DESYREL) 50 MG tablet Take 1 tablet (50 mg total) by mouth at bedtime. 08/09/19   Clapacs, Madie Reno, MD  triamcinolone (KENALOG)  0.025 % cream Apply 1 Application topically 2 (two) times daily. 09/27/21   Scot Jun, NP  witch hazel-glycerin (TUCKS) pad Apply 1 application topically as needed for hemorrhoids (for pain). 12/07/20   McVey, Murray Hodgkins, CNM    Family History Family History  Problem Relation Age of Onset   Cancer Mother 50       thyroid   Thyroid disease Mother    Hypertension Mother    Cancer Father 39       appendix - ca caused them to rupture   Hypertension Father    Cancer Paternal Grandmother 68       cervical    Social History Social History   Tobacco Use   Smoking status: Never   Smokeless tobacco: Never  Vaping Use   Vaping Use: Former  Substance Use Topics   Alcohol use: Not Currently    Comment: occasionally   Drug use: No     Allergies   Amoxicillin-pot clavulanate, Promethazine, Other, Tamiflu [oseltamivir], and Fluoxetine   Review of Systems Review of Systems Defer to HPI    Physical Exam Triage Vital Signs ED Triage Vitals  Enc Vitals Group     BP 06/16/22 1309 114/79     Pulse Rate 06/16/22 1309 60     Resp 06/16/22 1309 16     Temp 06/16/22 1309 (!) 97.4 F (36.3 C)     Temp Source 06/16/22 1309 Oral     SpO2 06/16/22 1309 98 %     Weight 06/16/22 1307 179 lb 14.3 oz (81.6 kg)     Height 06/16/22 1307 '5\' 3"'$  (1.6 m)     Head Circumference --      Peak Flow --      Pain Score 06/16/22 1307 0     Pain Loc --      Pain Edu? --      Excl. in [place]? --    No data found.  Updated Vital Signs BP 114/79 (BP Location: Left Arm)   Pulse 60   Temp (!) 97.4 F (36.3 C) (Oral)   Resp 16   Ht '5\' 3"'$  (1.6 m)   Wt 179 lb 14.3 oz (81.6 kg)   LMP 05/20/2022 (Approximate)   SpO2 98%   Breastfeeding No   BMI 31.87 kg/m   Visual Acuity Right Eye Distance:   Left Eye Distance:   Bilateral Distance:    Right Eye Near:   Left Eye Near:    Bilateral Near:     Physical Exam Constitutional:      Appearance: Normal appearance.  HENT:     Head:  Normocephalic.  Eyes:     Extraocular Movements: Extraocular movements intact.  Pulmonary:     Effort: Pulmonary effort is normal.  Musculoskeletal:     Comments: Moderate swelling and tenderness present over the lateral malleolus without ecchymosis or deformity, 2+ dorsalis pedis pulse, sensation intact, able to bear weight, able to complete range of motion but pain is elicited with flexion  Neurological:     Mental Status: She is alert and oriented to person, place, and time.      UC Treatments / Results  Labs (all labs ordered are listed, but only abnormal results are displayed) Labs Reviewed - No data to display  EKG   Radiology No results found.  Procedures Procedures (including critical care time)  Medications Ordered in UC Medications - No data to display  Initial Impression / Assessment and Plan / UC Course  I have reviewed the triage vital signs and the nursing notes.  Pertinent labs & imaging results that were available during my care of the patient were reviewed by me and considered in my medical decision making (see chart for details).  Acute right ankle pain, patella alta  X-ray of the ankle negative, knee x-ray showing above diagnosis, discussed both findings with the as needed, may continue crutches as needed, referral for orthopedics and physical therapy has been placed by primary doctor, advised patient to follow-up and keep upcoming appointments, recommended use of over-the-counter analgesics primarily NSAIDs and RICE with activity as tolerated for home management, may follow-up with his urgent care as needed Final Clinical Impressions(s) / UC Diagnoses   Final diagnoses:  None   Discharge Instructions   None    ED Prescriptions   None    PDMP not reviewed this encounter.   Hans Eden, NP 06/16/22 1437

## 2022-08-10 DIAGNOSIS — O0993 Supervision of high risk pregnancy, unspecified, third trimester: Secondary | ICD-10-CM | POA: Insufficient documentation

## 2022-11-24 ENCOUNTER — Ambulatory Visit: Payer: Self-pay

## 2023-02-10 DIAGNOSIS — O99210 Obesity complicating pregnancy, unspecified trimester: Secondary | ICD-10-CM | POA: Insufficient documentation

## 2023-02-27 ENCOUNTER — Observation Stay
Admission: EM | Admit: 2023-02-27 | Discharge: 2023-02-28 | Disposition: A | Discharge status: Home / Self Care | Payer: Medicaid Other | Attending: Obstetrics and Gynecology | Admitting: Obstetrics and Gynecology

## 2023-02-27 ENCOUNTER — Encounter: Payer: Self-pay | Admitting: Obstetrics and Gynecology

## 2023-02-27 ENCOUNTER — Other Ambulatory Visit: Payer: Self-pay

## 2023-02-27 DIAGNOSIS — O4703 False labor before 37 completed weeks of gestation, third trimester: Secondary | ICD-10-CM | POA: Diagnosis present

## 2023-02-27 DIAGNOSIS — Z3A37 37 weeks gestation of pregnancy: Secondary | ICD-10-CM | POA: Diagnosis not present

## 2023-02-27 DIAGNOSIS — Z79899 Other long term (current) drug therapy: Secondary | ICD-10-CM | POA: Diagnosis not present

## 2023-02-27 DIAGNOSIS — O99513 Diseases of the respiratory system complicating pregnancy, third trimester: Secondary | ICD-10-CM | POA: Diagnosis not present

## 2023-02-27 DIAGNOSIS — O479 False labor, unspecified: Principal | ICD-10-CM | POA: Diagnosis present

## 2023-02-27 DIAGNOSIS — J45909 Unspecified asthma, uncomplicated: Secondary | ICD-10-CM | POA: Insufficient documentation

## 2023-02-27 MED ORDER — CALCIUM CARBONATE ANTACID 500 MG PO CHEW: 2.0000 | CHEWABLE_TABLET | ORAL | Status: DC | PRN

## 2023-02-27 MED ORDER — DOCUSATE SODIUM 100 MG PO CAPS: 100.0000 mg | ORAL_CAPSULE | Freq: Every day | ORAL | Status: DC

## 2023-02-27 MED ORDER — ACETAMINOPHEN 325 MG PO TABS: 650.0000 mg | ORAL_TABLET | ORAL | Status: DC | PRN

## 2023-02-27 MED ORDER — PRENATAL MULTIVITAMIN CH: 1.0000 | ORAL_TABLET | Freq: Every day | ORAL | Status: DC

## 2023-02-27 NOTE — OB Triage Note (Signed)
Patient is a 32 yo, G4P2, at 37 weeks and 1 day. Patient presents with complaints of contractions. Pt reports contractions started at 1640 today. Patient denies any vaginal bleeding or LOF. Patient reports +FM. Monitors applied and assessing. Initial fetal heart tone is 125. D. Wilson CNM notified and aware of patients arrival to unit. Plan to do a SVE and labor eval.

## 2023-02-28 DIAGNOSIS — O4703 False labor before 37 completed weeks of gestation, third trimester: Secondary | ICD-10-CM | POA: Diagnosis not present

## 2023-02-28 NOTE — Discharge Summary (Signed)
Amanda Fisher is a 32 y.o. female. She is at [redacted]w[redacted]d gestation. Patient's last menstrual period was 05/20/2022 (exact date). Estimated Date of Delivery: 03/19/23  [facility]renatal care site: [facility]    Current pregnancy complicated by:  - history of vacuum-assisted vaginal delivery d/t fetal distress - GBS positive - Obesity - Depression with history of suicide attempt 2021 - asthma  Chief complaint: uterine contractions since 4pm  She reports uterine contractions that started around 4pm and have become closer together throughout the evening. The contractions are every 3-4 minutes but she can talk through them.   S: Resting comfortably. Regular CTX, no VB.no LOF,  Active fetal movement.  Denies: HA, visual changes, SOB, or RUQ/epigastric pain  Maternal Medical History:   Past Medical History:  Diagnosis Date   Acid reflux    Asthma    Depression    Hypoglycemia     Past Surgical History:  Procedure Laterality Date   TONSILLECTOMY AND ADENOIDECTOMY     WISDOM TOOTH EXTRACTION  2013    Allergies  Allergen Reactions   Amoxicillin-Pot Clavulanate Anaphylaxis   Promethazine Other (See Comments)    Shaking, restless legs Other reaction(s): Other (See Comments) Twitchy limbs  Twitchy limbs  Shaking, restless legs Shaking, restless legs   Other     SEASONAL   Tamiflu [Oseltamivir] Other (See Comments)    depression   Fluoxetine Rash    Prior to Admission medications   Medication Sig Start Date End Date Taking? Authorizing Provider  Prenatal Vit-Fe Fumarate-FA (PRENATAL MULTIVITAMIN) TABS tablet Take 1 tablet by mouth daily at 12 noon. 12/07/20  Yes McVey, Prudencio Pair, CNM  sertraline (ZOLOFT) 100 MG tablet Take 1 tablet (100 mg total) by mouth daily. 12/07/20  Yes McVey, Prudencio Pair, CNM  Spacer/Aero-Holding Chambers (AEROCHAMBER MV) inhaler Use as instructed 08/23/20  Yes Becky Augusta, NP  acetaminophen (TYLENOL) 325 MG tablet Take 2 tablets (650 mg total) by mouth every  4 (four) hours as needed (for pain scale < 4). Patient not taking: Reported on 02/27/2023 12/07/20   McVey, Prudencio Pair, CNM  benzocaine-Menthol (DERMOPLAST) 20-0.5 % AERO Apply 1 application topically as needed for irritation (perineal discomfort). Patient not taking: Reported on 02/27/2023 12/07/20   McVey, Prudencio Pair, CNM  chlorhexidine (PERIDEX) 0.12 % solution Use as directed 15 mLs in the mouth or throat 2 (two) times daily. Gargle and Spit Patient not taking: Reported on 02/27/2023 09/22/21   Bing Neighbors, NP  coconut oil OIL Apply 1 application topically as needed. Patient not taking: Reported on 02/27/2023 12/07/20   McVey, Prudencio Pair, CNM  desloratadine (CLARINEX) 5 MG tablet Take 1 tablet (5 mg total) by mouth daily. Patient not taking: Reported on 02/27/2023 09/23/19   Merrilee Jansky, MD  diphenhydrAMINE (BENADRYL) 25 mg capsule Take 1 capsule (25 mg total) by mouth every 6 (six) hours as needed for itching. Patient not taking: Reported on 02/27/2023 12/07/20   McVey, Prudencio Pair, CNM  ibuprofen (ADVIL) 600 MG tablet Take 1 tablet (600 mg total) by mouth every 6 (six) hours. Patient not taking: Reported on 02/27/2023 12/07/20   McVey, Prudencio Pair, CNM  loratadine (CLARITIN) 10 MG tablet Take 1 tablet (10 mg total) by mouth daily as needed for allergies. Patient not taking: Reported on 02/27/2023 09/27/21   Bing Neighbors, NP  montelukast (SINGULAIR) 10 MG tablet Take 10 mg by mouth daily. Patient not taking: Reported on 02/27/2023 08/11/19   [provider]  senna-docusate (SENOKOT-S) 8.6-50  MG tablet Take 2 tablets by mouth daily. Patient not taking: Reported on 02/27/2023 12/07/20   McVey, Prudencio Pair, CNM  sulfamethoxazole-trimethoprim (BACTRIM DS) 800-160 MG tablet Take 1 tablet by mouth 2 (two) times daily. Patient not taking: Reported on 02/27/2023 09/22/21   Bing Neighbors, NP  traZODone (DESYREL) 50 MG tablet Take 1 tablet (50 mg total) by mouth at bedtime. Patient not  taking: Reported on 02/27/2023 08/09/19   Clapacs, Jackquline Denmark, MD  triamcinolone (KENALOG) 0.025 % cream Apply 1 Application topically 2 (two) times daily. Patient not taking: Reported on 02/27/2023 09/27/21   Bing Neighbors, NP  witch hazel-glycerin (TUCKS) pad Apply 1 application topically as needed for hemorrhoids (for pain). Patient not taking: Reported on 02/27/2023 12/07/20   McVey, Prudencio Pair, CNM      Social History: She  reports that she has never smoked. She has never used smokeless tobacco. She reports that she does not currently use alcohol. She reports that she does not use drugs.  Family History: family history includes Cancer (age of onset: 53) in her mother; Cancer (age of onset: 18) in her father; Cancer (age of onset: 79) in her paternal grandmother; Hypertension in her father and mother; Thyroid disease in her mother.  no history of gyn cancers  Review of Systems: A full review of systems was performed and negative except as noted in the HPI.     O:  BP 121/72 (BP Location: Left Arm)   Pulse 82   Temp 98.8 F (37.1 C) (Oral)   Resp 18   LMP 05/20/2022 (Exact Date)  No results found for this or any previous visit (from the past 48 hour(s)).   Constitutional: NAD, AAOx3  HE/ENT: extraocular movements grossly intact, moist mucous membranes CV: RRR PULM: nl respiratory effort, CTABL     Abd: gravid, non-tender, non-distended, soft      Ext: Non-tender, Nonedematous   Psych: mood appropriate, speech normal Pelvic: 1/70/-3 -> 1.5/70/-3 -> 1.5/70/-3  Pelvic exam: normal external genitalia, vulva, vagina, cervix, uterus and adnexa.  Fetal  monitoring: Cat 1 Appropriate for GA Baseline: 125bpm Variability: moderate Accelerations: present x >2 Decelerations absent Contractions: 2-69min, palpate mild Time 3.5hrs  A/P: 32 y.o. [redacted]w[redacted]d here for antenatal surveillance for uterine contractions  Principle Diagnosis:  false labor  Labor: not present. Rechecked her cervix after  2hrs and minimally changed. She requested a longer evaluation so monitored for another 1.5hrs and cervix was the same. Fetal Wellbeing: Reassuring Cat 1 tracing. Reactive NST  D/c home stable, precautions reviewed, follow-up as scheduled.    Janyce Llanos, CNM 02/28/2023 8:40 AM

## 2023-02-28 NOTE — Progress Notes (Signed)
Discharge instructions provided to patient. Patient verbalized understanding. Pt educated on signs and symptoms of labor, vaginal bleeding, LOF, and fetal movement. Red flag signs reviewed by RN. Patient discharged home with significant other in stable condition.  

## 2023-03-02 ENCOUNTER — Encounter: Payer: Self-pay | Admitting: Obstetrics and Gynecology

## 2023-03-02 ENCOUNTER — Observation Stay
Admission: EM | Admit: 2023-03-02 | Discharge: 2023-03-02 | Disposition: A | Discharge status: Home / Self Care | Payer: Medicaid Other | Attending: Certified Nurse Midwife | Admitting: Certified Nurse Midwife

## 2023-03-02 ENCOUNTER — Other Ambulatory Visit: Payer: Self-pay

## 2023-03-02 DIAGNOSIS — O471 False labor at or after 37 completed weeks of gestation: Secondary | ICD-10-CM | POA: Diagnosis present

## 2023-03-02 DIAGNOSIS — E669 Obesity, unspecified: Secondary | ICD-10-CM | POA: Insufficient documentation

## 2023-03-02 DIAGNOSIS — Z3A37 37 weeks gestation of pregnancy: Secondary | ICD-10-CM | POA: Insufficient documentation

## 2023-03-02 DIAGNOSIS — O99213 Obesity complicating pregnancy, third trimester: Secondary | ICD-10-CM | POA: Diagnosis not present

## 2023-03-02 DIAGNOSIS — O99513 Diseases of the respiratory system complicating pregnancy, third trimester: Secondary | ICD-10-CM | POA: Diagnosis not present

## 2023-03-02 DIAGNOSIS — O479 False labor, unspecified: Principal | ICD-10-CM | POA: Diagnosis present

## 2023-03-02 MED ORDER — CALCIUM CARBONATE ANTACID 500 MG PO CHEW: 2.0000 | CHEWABLE_TABLET | ORAL | Status: DC | PRN

## 2023-03-02 MED ORDER — ACETAMINOPHEN 500 MG PO TABS: 1000.0000 mg | ORAL_TABLET | Freq: Four times a day (QID) | ORAL | Status: DC | PRN

## 2023-03-02 NOTE — Progress Notes (Signed)
Discharge instructions provided to patient. Patient verbalized understanding. Pt educated on signs and symptoms of labor, vaginal bleeding, LOF, and fetal movement. Red flag signs reviewed by RN. Patient discharged home with significant other in stable condition.  

## 2023-03-02 NOTE — OB Triage Note (Signed)
Patient arrived with complaints of decreased fetal movement but baby is moving. Baby has moved 5 times in the last hour. Pt also states she has been contractions since Saturday 3 min apart just not painful enough to come in yet. Denies  LOF, vaginal bleeding or any other concerns. A Mackie, CNmat bedside assessing pt.

## 2023-03-02 NOTE — Discharge Summary (Signed)
Amanda Fisher is a 32 y.o. female. She is at [redacted]w[redacted]d gestation. Patient's last menstrual period was 05/20/2022 (exact date). Estimated Date of Delivery: 03/19/23  Prenatal care site: [facility]    Current pregnancy complicated by:  - history of vacuum-assisted vaginal delivery d/t fetal distress - GBS positive - Obesity - Depression with history of suicide attempt 2021 - asthma  Chief complaint: uterine contractions, less fetal movement than she is used to this afternoon.  She reports this afternoon she has felt less movement than she is expecting, about 5 movements in 1 hour. However, her uterine contractions are strong and uncomfortable and are causing her to not notice fetal movement as well. She reports contractions q1min since Saturday that are unchanged.  S: Resting comfortably. Regular CTX, no VB.no LOF,  Active fetal movement.  Denies: HA, visual changes, SOB, or RUQ/epigastric pain  Maternal Medical History:   Past Medical History:  Diagnosis Date   Acid reflux    Asthma    Depression    Hypoglycemia     Past Surgical History:  Procedure Laterality Date   TONSILLECTOMY AND ADENOIDECTOMY     WISDOM TOOTH EXTRACTION  2013    Allergies  Allergen Reactions   Amoxicillin-Pot Clavulanate Anaphylaxis   Promethazine Other (See Comments)    Shaking, restless legs Other reaction(s): Other (See Comments) Twitchy limbs  Twitchy limbs  Shaking, restless legs Shaking, restless legs   Other     SEASONAL   Tamiflu [Oseltamivir] Other (See Comments)    depression   Fluoxetine Rash    Prior to Admission medications   Medication Sig Start Date End Date Taking? Authorizing Provider  albuterol (VENTOLIN HFA) 108 (90 Base) MCG/ACT inhaler Inhale 1-2 puffs into the lungs every 4 (four) hours as needed for wheezing. 05/07/20  Yes [provider]  Cholecalciferol 50 MCG (2000 UT) TABS Take 2 tablets by mouth daily.   Yes [provider]  Prenatal  Vit-Fe Fumarate-FA (PRENATAL MULTIVITAMIN) TABS tablet Take 1 tablet by mouth daily at 12 noon. 12/07/20  Yes McVey, Prudencio Pair, CNM  sertraline (ZOLOFT) 100 MG tablet Take 1 tablet (100 mg total) by mouth daily. 12/07/20  Yes McVey, Prudencio Pair, CNM  acetaminophen (TYLENOL) 325 MG tablet Take 2 tablets (650 mg total) by mouth every 4 (four) hours as needed (for pain scale < 4). Patient not taking: Reported on 02/27/2023 12/07/20   McVey, Prudencio Pair, CNM  benzocaine-Menthol (DERMOPLAST) 20-0.5 % AERO Apply 1 application topically as needed for irritation (perineal discomfort). Patient not taking: Reported on 02/27/2023 12/07/20   McVey, Prudencio Pair, CNM  chlorhexidine (PERIDEX) 0.12 % solution Use as directed 15 mLs in the mouth or throat 2 (two) times daily. Gargle and Spit Patient not taking: Reported on 02/27/2023 09/22/21   Bing Neighbors, NP  coconut oil OIL Apply 1 application topically as needed. Patient not taking: Reported on 02/27/2023 12/07/20   McVey, Prudencio Pair, CNM  desloratadine (CLARINEX) 5 MG tablet Take 1 tablet (5 mg total) by mouth daily. Patient not taking: Reported on 02/27/2023 09/23/19   Merrilee Jansky, MD  diphenhydrAMINE (BENADRYL) 25 mg capsule Take 1 capsule (25 mg total) by mouth every 6 (six) hours as needed for itching. Patient not taking: Reported on 02/27/2023 12/07/20   McVey, Prudencio Pair, CNM  ibuprofen (ADVIL) 600 MG tablet Take 1 tablet (600 mg total) by mouth every 6 (six) hours. Patient not taking: Reported on 02/27/2023 12/07/20   McVey, Prudencio Pair, CNM  loratadine (CLARITIN) 10 MG tablet Take 1 tablet (10 mg total) by mouth daily as needed for allergies. Patient not taking: Reported on 02/27/2023 09/27/21   Bing Neighbors, NP  montelukast (SINGULAIR) 10 MG tablet Take 10 mg by mouth daily. Patient not taking: Reported on 02/27/2023 08/11/19   [provider]  senna-docusate (SENOKOT-S) 8.6-50 MG tablet Take 2 tablets by mouth daily. Patient not taking: Reported on  02/27/2023 12/07/20   McVey, Prudencio Pair, CNM  Spacer/Aero-Holding Chambers (AEROCHAMBER MV) inhaler Use as instructed 08/23/20   Becky Augusta, NP  sulfamethoxazole-trimethoprim (BACTRIM DS) 800-160 MG tablet Take 1 tablet by mouth 2 (two) times daily. Patient not taking: Reported on 02/27/2023 09/22/21   Bing Neighbors, NP  traZODone (DESYREL) 50 MG tablet Take 1 tablet (50 mg total) by mouth at bedtime. Patient not taking: Reported on 02/27/2023 08/09/19   Clapacs, Jackquline Denmark, MD  triamcinolone (KENALOG) 0.025 % cream Apply 1 Application topically 2 (two) times daily. Patient not taking: Reported on 02/27/2023 09/27/21   Bing Neighbors, NP  witch hazel-glycerin (TUCKS) pad Apply 1 application topically as needed for hemorrhoids (for pain). Patient not taking: Reported on 02/27/2023 12/07/20   McVey, Prudencio Pair, CNM     Social History: She  reports that she has never smoked. She has never used smokeless tobacco. She reports that she does not currently use alcohol. She reports that she does not use drugs.  Family History: family history includes Cancer (age of onset: 29) in her mother; Cancer (age of onset: 41) in her father; Cancer (age of onset: 32) in her paternal grandmother; Hypertension in her father and mother; Thyroid disease in her mother.  no history of gyn cancers  Review of Systems: A full review of systems was performed and negative except as noted in the HPI.     O:  LMP 05/20/2022 (Exact Date)  No results found for this or any previous visit (from the past 48 hour(s)).   Constitutional: NAD, AAOx3  HE/ENT: extraocular movements grossly intact, moist mucous membranes CV: RRR PULM: nl respiratory effort, CTABL     Abd: gravid, non-tender, non-distended, soft      Ext: Non-tender, Nonedematous   Psych: mood appropriate, speech normal Pelvic: 1.5/50/-2, unchanged after 2 hours  Pelvic exam: normal external genitalia, vulva, vagina, cervix, uterus and adnexa.  Fetal  monitoring:  Cat 1 Appropriate for GA Baseline: 130bpm Variability: moderate Accelerations: present x >2 Decelerations absent Contractions q3-77min, palpate mild Time 2 hours  A/P: 32 y.o. [redacted]w[redacted]d here for antenatal surveillance for uterine contractions   Principle Diagnosis:  False labor  Labor: not present. Cervix unchanged and similar to when she was seen in triage 02/28/23. Fetal Wellbeing: Reassuring Cat 1 tracing. She is feeling lots of fetal movement. Advised to continue regular fkc and return with concerns.  Reactive NST  D/c home stable, precautions reviewed, follow-up as scheduled.    Janyce Llanos, CNM 03/02/2023 6:52 PM

## 2023-03-13 ENCOUNTER — Observation Stay
Admission: EM | Admit: 2023-03-13 | Discharge: 2023-03-13 | Disposition: A | Discharge status: Home / Self Care | Payer: Medicaid Other | Source: Home / Self Care | Admitting: Obstetrics and Gynecology

## 2023-03-13 ENCOUNTER — Other Ambulatory Visit: Payer: Self-pay

## 2023-03-13 ENCOUNTER — Encounter: Payer: Self-pay | Admitting: Obstetrics and Gynecology

## 2023-03-13 DIAGNOSIS — O479 False labor, unspecified: Principal | ICD-10-CM

## 2023-03-13 DIAGNOSIS — J45909 Unspecified asthma, uncomplicated: Secondary | ICD-10-CM | POA: Insufficient documentation

## 2023-03-13 DIAGNOSIS — O471 False labor at or after 37 completed weeks of gestation: Principal | ICD-10-CM | POA: Insufficient documentation

## 2023-03-13 DIAGNOSIS — Z79899 Other long term (current) drug therapy: Secondary | ICD-10-CM | POA: Insufficient documentation

## 2023-03-13 DIAGNOSIS — O99513 Diseases of the respiratory system complicating pregnancy, third trimester: Secondary | ICD-10-CM | POA: Insufficient documentation

## 2023-03-13 DIAGNOSIS — Z3A39 39 weeks gestation of pregnancy: Secondary | ICD-10-CM | POA: Insufficient documentation

## 2023-03-13 MED ORDER — ONDANSETRON 4 MG PO TBDP
4.0000 mg | ORAL_TABLET | Freq: Once | ORAL | Status: AC
  Administered 2023-03-13: 4 mg via ORAL
  Filled 2023-03-13: qty 1

## 2023-03-13 MED ORDER — ONDANSETRON 4 MG PO TBDP: 4.0000 mg | ORAL_TABLET | Freq: Three times a day (TID) | ORAL | 0 refills | Status: DC | PRN

## 2023-03-13 MED ORDER — ACETAMINOPHEN 500 MG PO TABS: 1000.0000 mg | ORAL_TABLET | Freq: Four times a day (QID) | ORAL | Status: DC | PRN

## 2023-03-13 MED ORDER — ZOLPIDEM TARTRATE 5 MG PO TABS
5.0000 mg | ORAL_TABLET | Freq: Every evening | ORAL | Status: DC | PRN
  Administered 2023-03-13: 5 mg via ORAL
  Filled 2023-03-13: qty 1

## 2023-03-13 NOTE — Discharge Summary (Signed)
Amanda Fisher is a 32 y.o. female. She is at [redacted]w[redacted]d gestation. Patient's last menstrual period was 05/20/2022 (exact date). Estimated Date of Delivery: 03/19/23  Prenatal care site: [facility]  Chief complaint: uterine contractions HPI: [name] presents to L&D with complaints of Uterine contractions: Date/time of onset: 03/13/23 a few days ago, Frequency: Every 1-9 minutes, and Intensity: rates 6/10  Factors complicating pregnancy: - history of vacuum-assisted vaginal delivery d/t fetal distress GBS positive Obesity Depression with history of suicide attempt 2021 asthma  S: Resting comfortably. no VB.no LOF,  Active fetal movement.   Maternal Medical History:  Past Medical Hx:  has a past medical history of Acid reflux, Asthma, Depression, and Hypoglycemia.    Past Surgical Hx:  has a past surgical history that includes Tonsillectomy and adenoidectomy and Wisdom tooth extraction (2013).   Allergies  Allergen Reactions   Amoxicillin-Pot Clavulanate Anaphylaxis   Promethazine Other (See Comments)    Shaking, restless legs Other reaction(s): Other (See Comments) Twitchy limbs  Twitchy limbs  Shaking, restless legs Shaking, restless legs   Other     SEASONAL   Tamiflu [Oseltamivir] Other (See Comments)    depression   Fluoxetine Rash     Prior to Admission medications   Medication Sig Start Date End Date Taking? Authorizing Provider  sertraline (ZOLOFT) 100 MG tablet Take 1 tablet (100 mg total) by mouth daily. 12/07/20  Yes McVey, Prudencio Pair, CNM  acetaminophen (TYLENOL) 325 MG tablet Take 2 tablets (650 mg total) by mouth every 4 (four) hours as needed (for pain scale < 4). Patient not taking: Reported on 02/27/2023 12/07/20   McVey, Prudencio Pair, CNM  albuterol (VENTOLIN HFA) 108 (90 Base) MCG/ACT inhaler Inhale 1-2 puffs into the lungs every 4 (four) hours as needed for wheezing. 05/07/20   [provider]  benzocaine-Menthol (DERMOPLAST) 20-0.5 % AERO Apply 1  application topically as needed for irritation (perineal discomfort). Patient not taking: Reported on 02/27/2023 12/07/20   McVey, Prudencio Pair, CNM  chlorhexidine (PERIDEX) 0.12 % solution Use as directed 15 mLs in the mouth or throat 2 (two) times daily. Gargle and Spit Patient not taking: Reported on 02/27/2023 09/22/21   Bing Neighbors, NP  Cholecalciferol 50 MCG (2000 UT) TABS Take 2 tablets by mouth daily.    [provider]  coconut oil OIL Apply 1 application topically as needed. Patient not taking: Reported on 02/27/2023 12/07/20   McVey, Prudencio Pair, CNM  desloratadine (CLARINEX) 5 MG tablet Take 1 tablet (5 mg total) by mouth daily. Patient not taking: Reported on 02/27/2023 09/23/19   Merrilee Jansky, MD  diphenhydrAMINE (BENADRYL) 25 mg capsule Take 1 capsule (25 mg total) by mouth every 6 (six) hours as needed for itching. Patient not taking: Reported on 02/27/2023 12/07/20   McVey, Prudencio Pair, CNM  ibuprofen (ADVIL) 600 MG tablet Take 1 tablet (600 mg total) by mouth every 6 (six) hours. Patient not taking: Reported on 02/27/2023 12/07/20   McVey, Prudencio Pair, CNM  loratadine (CLARITIN) 10 MG tablet Take 1 tablet (10 mg total) by mouth daily as needed for allergies. Patient not taking: Reported on 02/27/2023 09/27/21   Bing Neighbors, NP  montelukast (SINGULAIR) 10 MG tablet Take 10 mg by mouth daily. Patient not taking: Reported on 02/27/2023 08/11/19   [provider]  Prenatal Vit-Fe Fumarate-FA (PRENATAL MULTIVITAMIN) TABS tablet Take 1 tablet by mouth daily at 12 noon. 12/07/20   McVey, Prudencio Pair, CNM  senna-docusate (SENOKOT-S) 8.6-50 MG  tablet Take 2 tablets by mouth daily. Patient not taking: Reported on 02/27/2023 12/07/20   McVey, Prudencio Pair, CNM  Spacer/Aero-Holding Chambers (AEROCHAMBER MV) inhaler Use as instructed 08/23/20   Becky Augusta, NP  sulfamethoxazole-trimethoprim (BACTRIM DS) 800-160 MG tablet Take 1 tablet by mouth 2 (two) times daily. Patient not taking:  Reported on 02/27/2023 09/22/21   Bing Neighbors, NP  traZODone (DESYREL) 50 MG tablet Take 1 tablet (50 mg total) by mouth at bedtime. Patient not taking: Reported on 02/27/2023 08/09/19   Clapacs, Jackquline Denmark, MD  triamcinolone (KENALOG) 0.025 % cream Apply 1 Application topically 2 (two) times daily. Patient not taking: Reported on 02/27/2023 09/27/21   Bing Neighbors, NP  witch hazel-glycerin (TUCKS) pad Apply 1 application topically as needed for hemorrhoids (for pain). Patient not taking: Reported on 02/27/2023 12/07/20   McVey, Prudencio Pair, CNM    Social History: She  reports that she has never smoked. She has never used smokeless tobacco. She reports that she does not currently use alcohol. She reports that she does not use drugs.  Family History: family history includes Cancer (age of onset: 35) in her mother; Cancer (age of onset: 51) in her father; Cancer (age of onset: 57) in her paternal grandmother; Hypertension in her father and mother; Thyroid disease in her mother. ,no history of gyn cancers  Review of Systems: A full review of systems was performed and negative except as noted in the HPI.    O:  BP 122/84 (BP Location: Right Arm)   Pulse 76   Temp 97.7 F (36.5 C) (Oral)   Resp 18   Ht 5\' 3"  (1.6 m)   Wt 82.6 kg   LMP 05/20/2022 (Exact Date)   BMI 32.24 kg/m  No results found for this or any previous visit (from the past 48 hour(s)).   Constitutional: NAD, AAOx3  HE/ENT: extraocular movements grossly intact, moist mucous membranes CV: RRR PULM: nl respiratory effort, CTABL Abd: gravid, non-tender, non-distended, soft  Ext: Non-tender, Nonedmeatous Psych: mood appropriate, speech normal Pelvic : deferred SVE: Dilation: 4.5 Effacement (%): 50 Cervical Position: Anterior Station: Ballotable Presentation: Vertex Exam by:: Elks, RNC   NST: Baseline FHR: 130 beats/min Variability: moderate Accelerations: present Decelerations: absent Tocometry: 3-9 Time: at  least 20 minutes   Interpretation: Category I INDICATIONS: rule out active labor RESULTS:  A NST procedure was performed with FHR monitoring and a normal baseline established, appropriate time of 20-40 minutes of evaluation, and accels >2 seen w 15x15 characteristics.  Results show a REACTIVE NST.    Assessment: 32 y.o. [redacted]w[redacted]d here for antenatal surveillance during pregnancy. Patient reports with complaints of being in early labor for a few days. She reports having painful uterine contractions every 1-8 minutes, rates them a 6/10. She was checked for cervical dilation and monitored > 2 hours without cervical change. She is 4/50/ballotable. She was reassured she is in prodromal labor and to come back to the hospital when contractions are about 5 mins apart for about an hour or if she experience her water breaking or any other warning signs such as vaginal bleeding  Principle diagnosis: uterine contractions  Plan: Labor: Prodromal.  Fetal Wellbeing: Reassuring Cat 1 tracing. Reactive NST  Tylenol 1000 mg as needed for pain Q6 D/c home stable, precautions reviewed, follow-up as scheduled.   ----- Chari Manning, CNM Certified Nurse Midwife [facility]

## 2023-03-13 NOTE — OB Triage Note (Signed)
Patient is a G4P2, [redacted]w[redacted]d that presents with complaint of "being in early labor for days" and having contractions "off and on". No LOF. No bleeding. +FM. Patient states that she called the triage line and the midwife told her to come in. Patient rates ctx 6 out of 10 on a 0-10 pain scale. Patient does report some nausea. Monitors applied and assessing. VSS

## 2023-03-14 ENCOUNTER — Inpatient Hospital Stay: Admission: RE | Admit: 2023-03-14 | Payer: Medicaid Other | Source: Home / Self Care

## 2023-03-14 ENCOUNTER — Inpatient Hospital Stay
Admission: EM | Admit: 2023-03-14 | Discharge: 2023-03-16 | DRG: 806 | Disposition: A | Discharge status: Home / Self Care | Payer: Medicaid Other | Attending: Obstetrics and Gynecology | Admitting: Obstetrics and Gynecology

## 2023-03-14 ENCOUNTER — Other Ambulatory Visit: Payer: Self-pay

## 2023-03-14 DIAGNOSIS — O99344 Other mental disorders complicating childbirth: Secondary | ICD-10-CM | POA: Diagnosis present

## 2023-03-14 DIAGNOSIS — O26893 Other specified pregnancy related conditions, third trimester: Secondary | ICD-10-CM | POA: Diagnosis present

## 2023-03-14 DIAGNOSIS — Z888 Allergy status to other drugs, medicaments and biological substances status: Secondary | ICD-10-CM

## 2023-03-14 DIAGNOSIS — Z887 Allergy status to serum and vaccine status: Secondary | ICD-10-CM | POA: Diagnosis not present

## 2023-03-14 DIAGNOSIS — Z3A39 39 weeks gestation of pregnancy: Secondary | ICD-10-CM | POA: Diagnosis not present

## 2023-03-14 DIAGNOSIS — Z8249 Family history of ischemic heart disease and other diseases of the circulatory system: Secondary | ICD-10-CM

## 2023-03-14 DIAGNOSIS — O471 False labor at or after 37 completed weeks of gestation: Secondary | ICD-10-CM | POA: Diagnosis present

## 2023-03-14 DIAGNOSIS — Z79899 Other long term (current) drug therapy: Secondary | ICD-10-CM

## 2023-03-14 DIAGNOSIS — J45909 Unspecified asthma, uncomplicated: Secondary | ICD-10-CM | POA: Diagnosis present

## 2023-03-14 DIAGNOSIS — O99214 Obesity complicating childbirth: Secondary | ICD-10-CM | POA: Diagnosis present

## 2023-03-14 DIAGNOSIS — O9081 Anemia of the puerperium: Secondary | ICD-10-CM | POA: Diagnosis not present

## 2023-03-14 DIAGNOSIS — F32A Depression, unspecified: Secondary | ICD-10-CM | POA: Diagnosis present

## 2023-03-14 DIAGNOSIS — Z880 Allergy status to penicillin: Secondary | ICD-10-CM

## 2023-03-14 DIAGNOSIS — R197 Diarrhea, unspecified: Secondary | ICD-10-CM | POA: Diagnosis not present

## 2023-03-14 DIAGNOSIS — O99513 Diseases of the respiratory system complicating pregnancy, third trimester: Secondary | ICD-10-CM | POA: Diagnosis present

## 2023-03-14 DIAGNOSIS — D62 Acute posthemorrhagic anemia: Secondary | ICD-10-CM | POA: Diagnosis not present

## 2023-03-14 DIAGNOSIS — O99824 Streptococcus B carrier state complicating childbirth: Secondary | ICD-10-CM | POA: Diagnosis present

## 2023-03-14 MED ORDER — FENTANYL CITRATE (PF) 100 MCG/2ML IJ SOLN: 50.0000 ug | INTRAMUSCULAR | Status: DC | PRN

## 2023-03-14 MED ORDER — FENTANYL CITRATE (PF) 100 MCG/2ML IJ SOLN
INTRAMUSCULAR | Status: AC
  Filled 2023-03-14: qty 2

## 2023-03-14 MED ORDER — LACTATED RINGERS IV SOLN: 500.0000 mL | INTRAVENOUS | Status: DC | PRN

## 2023-03-14 MED ORDER — OXYTOCIN 10 UNIT/ML IJ SOLN
INTRAMUSCULAR | Status: AC
  Filled 2023-03-14: qty 2

## 2023-03-14 MED ORDER — MISOPROSTOL 200 MCG PO TABS
ORAL_TABLET | ORAL | Status: AC
  Filled 2023-03-14: qty 4

## 2023-03-14 MED ORDER — OXYTOCIN BOLUS FROM INFUSION
333.0000 mL | Freq: Once | INTRAVENOUS | Status: AC
  Administered 2023-03-14: 333 mL via INTRAVENOUS

## 2023-03-14 MED ORDER — LIDOCAINE HCL (PF) 1 % IJ SOLN
INTRAMUSCULAR | Status: AC
  Filled 2023-03-14: qty 30

## 2023-03-14 MED ORDER — ONDANSETRON HCL 4 MG/2ML IJ SOLN: 4.0000 mg | Freq: Four times a day (QID) | INTRAMUSCULAR | Status: DC | PRN

## 2023-03-14 MED ORDER — ACETAMINOPHEN 325 MG PO TABS: 650.0000 mg | ORAL_TABLET | ORAL | Status: DC | PRN

## 2023-03-14 MED ORDER — OXYTOCIN-SODIUM CHLORIDE 30-0.9 UT/500ML-% IV SOLN
2.5000 [IU]/h | INTRAVENOUS | Status: DC
  Administered 2023-03-14: 2.5 [IU]/h via INTRAVENOUS

## 2023-03-14 MED ORDER — LIDOCAINE HCL (PF) 1 % IJ SOLN: 30.0000 mL | INTRAMUSCULAR | Status: DC | PRN

## 2023-03-14 MED ORDER — SOD CITRATE-CITRIC ACID 500-334 MG/5ML PO SOLN: 30.0000 mL | ORAL | Status: DC | PRN

## 2023-03-14 MED ORDER — LACTATED RINGERS IV SOLN: INTRAVENOUS | Status: DC

## 2023-03-14 MED ORDER — OXYTOCIN-SODIUM CHLORIDE 30-0.9 UT/500ML-% IV SOLN
INTRAVENOUS | Status: AC
  Filled 2023-03-14: qty 500

## 2023-03-14 MED ORDER — AMMONIA AROMATIC IN INHA
RESPIRATORY_TRACT | Status: AC
  Filled 2023-03-14: qty 10

## 2023-03-14 NOTE — H&P (Cosign Needed Addendum)
OB History & Physical   History of Present Illness:   Chief Complaint: uterine contractions  HPI:  Amanda Fisher is a 32 y.o. [phone] female at [redacted]w[redacted]d, Patient's last menstrual period was 05/20/2022 (exact date)., not consistent with [place] at [redacted]w[redacted]d, with Estimated Date of Delivery: 03/19/23.  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   Suicide attempt by drug ingestion (HCC) 08/07/2019   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   OCD (obsessive compulsive disorder) 08/07/2019   Diphenhydramine overdose 08/07/2019   Asthma 04/09/2019    Prenatal Transfer Tool  Maternal Diabetes: No Genetic Screening: Normal Maternal Ultrasounds/Referrals: Normal Fetal Ultrasounds or other Referrals:  None Maternal Substance Abuse:  No Significant Maternal Medications:  Meds include: Zoloft Other: albuterol Significant Maternal Lab Results: Group B Strep positive  Maternal Medical History:   Past Medical History:  Diagnosis Date   Acid reflux    Asthma    Depression    Hypoglycemia     Past Surgical History:  Procedure Laterality Date   TONSILLECTOMY AND ADENOIDECTOMY     WISDOM TOOTH EXTRACTION  2013    Allergies  Allergen Reactions   Amoxicillin-Pot Clavulanate Anaphylaxis   Promethazine Other (See Comments)    Shaking, restless legs Other reaction(s): Other (See Comments) Twitchy limbs  Twitchy  limbs  Shaking, restless legs Shaking, restless legs   Other     SEASONAL   Tamiflu [Oseltamivir] Other (See Comments)    depression   Fluoxetine Rash    Prior to Admission medications   Medication Sig Start Date End Date Taking? Authorizing Provider  acetaminophen (TYLENOL) 500 MG tablet Take 2 tablets (1,000 mg total) by mouth every 6 (six) hours as needed for mild pain (pain score 1-3), moderate pain (pain score 4-6) or headache. 03/13/23 03/12/24  Chari Manning, CNM  albuterol (VENTOLIN HFA) 108 (90 Base) MCG/ACT inhaler Inhale 1-2 puffs into the lungs every 4 (four) hours as needed for wheezing. 05/07/20   [provider]  Cholecalciferol 50 MCG (2000 UT) TABS Take 2 tablets by mouth daily.    [provider]  ondansetron (ZOFRAN-ODT) 4 MG disintegrating tablet Take 1 tablet (4 mg total) by mouth every 8 (eight) hours as needed for nausea or vomiting. 03/13/23   Chari Manning, CNM  Prenatal Vit-Fe Fumarate-FA (PRENATAL MULTIVITAMIN) TABS tablet Take 1 tablet by mouth daily at 12 noon. 12/07/20   McVey, Prudencio Pair, CNM  sertraline (ZOLOFT) 100 MG tablet Take 1 tablet (100 mg total) by mouth daily. 12/07/20   McVey, Prudencio Pair, CNM  Spacer/Aero-Holding Chambers (AEROCHAMBER MV) inhaler Use as instructed 08/23/20   Becky Augusta, NP  traZODone (DESYREL) 50 MG tablet Take 1 tablet (50 mg total) by mouth at bedtime. Patient not taking: Reported on 02/27/2023 08/09/19   Clapacs, Jackquline Denmark, MD     Prenatal care site:  [facility]  OB History  Gravida Para Term Preterm AB Living  4 2 2  0 1 2  SAB  IAB Ectopic Multiple Live Births  1 0 0 0 2    # Outcome Date GA Lbr Len/2nd Weight Sex Type Anes PTL Lv  4 Current           3 Term 12/06/20 [redacted]w[redacted]d 01:45 / 00:24 3180 g M Vag-Vacuum EPI  LIV     Name: Amanda Fisher     Apgar1: 7  Apgar5: 9  2 Term 10/29/18 [redacted]w[redacted]d  3600 g M Vag-Spont   LIV  1 SAB 04/30/17 [redacted]w[redacted]d            Social History: She  reports that she has  never smoked. She has never used smokeless tobacco. She reports that she does not currently use alcohol. She reports that she does not use drugs.  Family History: family history includes Cancer (age of onset: 81) in her mother; Cancer (age of onset: 52) in her father; Cancer (age of onset: 56) in her paternal grandmother; Hypertension in her father and mother; Thyroid disease in her mother.   Review of Systems: A full review of systems was performed and negative except as noted in the HPI.     Physical Exam:  Vital Signs: LMP 05/20/2022 (Exact Date)   General: no acute distress.  HEENT: normocephalic, atraumatic Heart: regular rate & rhythm Lungs: normal respiratory effort Abdomen: soft, gravid, non-tender; Pelvic:   External: Normal external female genitalia  Cervix:   /   /      Extremities: non-tender, symmetric, no edema bilaterally.    Neurologic: Alert & oriented x 3.    No results found for this or any previous visit (from the past 24 hour(s)).  Pertinent Results:  Prenatal Labs: Blood type/Rh A POS   Antibody screen Negative    Rubella immune    Varicella Immune  RPR NR    HBsAg Neg   Hep C NR   HIV NR    GC neg  Chlamydia neg  Genetic screening cfDNA negative   1 hour GTT 93  3 hour GTT NR  GBS Pos     FHT:    Assessment:  Amanda Fisher is a 32 y.o. [phone] female at [redacted]w[redacted]d with Precipitous labor and delivery. Patient SROM'd in elevator for clear fluid prior to arriving to the labor and delivery unit. Patient placed in room and precipitously delivered on hands and knees.before being formally admitted to the unit. Unable to assess fetal heart tones due to timing of delivery and maternal positioning. Patient is  GBS positive, has hx of depression with hx of suicide attempt in 2021, asthma, heart palpitations and obesity.  Plan:  1. Admit to Labor & Delivery  2. GBS pos- untreated  3. Depression- Zoloft 100 mg ordered  4.Pain control- IV fentanyl given after  delivery  5. Post Partum Planning: - Infant feeding:  TBD - Contraception: Contraceptives: Vasectomy - Tdap vaccine:  declined - Flu vaccine: declined -RSV vaccine:declined 6. Baby to NICU  Chari Manning, CNM 03/14/23 11:37 PM  Chari Manning, CNM Certified Nurse Midwife [place]  [facility]

## 2023-03-15 ENCOUNTER — Encounter: Payer: Self-pay | Admitting: Obstetrics and Gynecology

## 2023-03-15 LAB — CBC
HCT: 39.5 % (ref 36.0–46.0)
HCT: 34.6 % — ABNORMAL LOW (ref 36.0–46.0)
Hemoglobin: 12.7 g/dL (ref 12.0–15.0)
Hemoglobin: 11.3 g/dL — ABNORMAL LOW (ref 12.0–15.0)
MCH: 25.1 pg — ABNORMAL LOW (ref 26.0–34.0)
MCH: 24.9 pg — ABNORMAL LOW (ref 26.0–34.0)
MCHC: 32.2 g/dL (ref 30.0–36.0)
MCHC: 32.7 g/dL (ref 30.0–36.0)
MCV: 78.2 fL — ABNORMAL LOW (ref 80.0–100.0)
MCV: 76.4 fL — ABNORMAL LOW (ref 80.0–100.0)
Platelets: 172 10*3/uL (ref 150–400)
Platelets: 155 10*3/uL (ref 150–400)
RBC: 5.05 MIL/uL (ref 3.87–5.11)
RBC: 4.53 MIL/uL (ref 3.87–5.11)
RDW: 16.2 % — ABNORMAL HIGH (ref 11.5–15.5)
RDW: 16 % — ABNORMAL HIGH (ref 11.5–15.5)
WBC: 12.4 10*3/uL — ABNORMAL HIGH (ref 4.0–10.5)
WBC: 9.6 10*3/uL (ref 4.0–10.5)
nRBC: 0 % (ref 0.0–0.2)
nRBC: 0 % (ref 0.0–0.2)

## 2023-03-15 LAB — SARS CORONAVIRUS 2 BY RT PCR: SARS Coronavirus 2 by RT PCR: NEGATIVE

## 2023-03-15 LAB — RPR: RPR Ser Ql: NONREACTIVE

## 2023-03-15 LAB — TYPE AND SCREEN
ABO/RH(D): A POS
Antibody Screen: NEGATIVE

## 2023-03-15 MED ORDER — OXYCODONE HCL 5 MG PO TABS: 5.0000 mg | ORAL_TABLET | ORAL | Status: DC | PRN

## 2023-03-15 MED ORDER — SIMETHICONE 80 MG PO CHEW
80.0000 mg | CHEWABLE_TABLET | ORAL | Status: DC | PRN
Start: 2023-03-15 — End: 2023-03-16

## 2023-03-15 MED ORDER — DIPHENHYDRAMINE HCL 25 MG PO CAPS: 25.0000 mg | ORAL_CAPSULE | Freq: Four times a day (QID) | ORAL | Status: DC | PRN

## 2023-03-15 MED ORDER — ONDANSETRON HCL 4 MG PO TABS
4.0000 mg | ORAL_TABLET | ORAL | Status: DC | PRN
  Administered 2023-03-15 – 2023-03-16 (×2): 4 mg via ORAL
  Filled 2023-03-15 (×2): qty 1

## 2023-03-15 MED ORDER — SERTRALINE HCL 100 MG PO TABS
100.0000 mg | ORAL_TABLET | Freq: Every day | ORAL | Status: DC
  Administered 2023-03-15: 100 mg via ORAL
  Filled 2023-03-15: qty 1

## 2023-03-15 MED ORDER — IBUPROFEN 600 MG PO TABS
600.0000 mg | ORAL_TABLET | Freq: Four times a day (QID) | ORAL | Status: DC
  Administered 2023-03-15 – 2023-03-16 (×6): 600 mg via ORAL
  Filled 2023-03-15 (×5): qty 1

## 2023-03-15 MED ORDER — BISACODYL 10 MG RE SUPP: 10.0000 mg | Freq: Every day | RECTAL | Status: DC | PRN

## 2023-03-15 MED ORDER — COCONUT OIL OIL
1.0000 | TOPICAL_OIL | Status: DC | PRN
  Administered 2023-03-15: 1 via TOPICAL
  Filled 2023-03-15: qty 15

## 2023-03-15 MED ORDER — PRENATAL MULTIVITAMIN CH
1.0000 | ORAL_TABLET | Freq: Every day | ORAL | Status: DC
  Administered 2023-03-15: 1 via ORAL
  Filled 2023-03-15: qty 1

## 2023-03-15 MED ORDER — ACETAMINOPHEN 325 MG PO TABS
650.0000 mg | ORAL_TABLET | ORAL | Status: DC | PRN
  Administered 2023-03-15: 650 mg via ORAL
  Filled 2023-03-15 (×2): qty 2

## 2023-03-15 MED ORDER — SODIUM CHLORIDE 0.9% FLUSH
3.0000 mL | Freq: Two times a day (BID) | INTRAVENOUS | Status: DC
Start: 2023-03-15 — End: 2023-03-15
  Administered 2023-03-15: 3 mL via INTRAVENOUS

## 2023-03-15 MED ORDER — ZOLPIDEM TARTRATE 5 MG PO TABS: 5.0000 mg | ORAL_TABLET | Freq: Every evening | ORAL | Status: DC | PRN

## 2023-03-15 MED ORDER — SODIUM CHLORIDE 0.9 % IV SOLN: 250.0000 mL | INTRAVENOUS | Status: DC | PRN

## 2023-03-15 MED ORDER — FLEET ENEMA RE ENEM: 1.0000 | ENEMA | Freq: Every day | RECTAL | Status: DC | PRN

## 2023-03-15 MED ORDER — ONDANSETRON HCL 4 MG/2ML IJ SOLN: 4.0000 mg | INTRAMUSCULAR | Status: DC | PRN

## 2023-03-15 MED ORDER — SODIUM CHLORIDE 0.9% FLUSH: 3.0000 mL | INTRAVENOUS | Status: DC | PRN

## 2023-03-15 MED ORDER — LOPERAMIDE HCL 2 MG PO CAPS
2.0000 mg | ORAL_CAPSULE | ORAL | Status: DC | PRN
Start: 2023-03-15 — End: 2023-03-16
  Administered 2023-03-15 – 2023-03-16 (×3): 2 mg via ORAL
  Filled 2023-03-15 (×5): qty 1

## 2023-03-15 MED ORDER — SENNOSIDES-DOCUSATE SODIUM 8.6-50 MG PO TABS
2.0000 | ORAL_TABLET | ORAL | Status: DC
  Administered 2023-03-15: 2 via ORAL
  Filled 2023-03-15: qty 2

## 2023-03-15 MED ORDER — WITCH HAZEL-GLYCERIN EX PADS
1.0000 | MEDICATED_PAD | CUTANEOUS | Status: DC | PRN
  Filled 2023-03-15: qty 100

## 2023-03-15 MED ORDER — BENZOCAINE-MENTHOL 20-0.5 % EX AERO
1.0000 | INHALATION_SPRAY | CUTANEOUS | Status: DC | PRN
  Administered 2023-03-15: 1 via TOPICAL
  Filled 2023-03-15: qty 56

## 2023-03-15 MED ORDER — DIBUCAINE (PERIANAL) 1 % EX OINT
1.0000 | TOPICAL_OINTMENT | CUTANEOUS | Status: DC | PRN
  Filled 2023-03-15: qty 28

## 2023-03-15 MED ORDER — IBUPROFEN 600 MG PO TABS
ORAL_TABLET | ORAL | Status: AC
  Filled 2023-03-15: qty 1

## 2023-03-15 NOTE — Progress Notes (Signed)
Pt complaining of not feeling well around 1930. Asked for nausea medicine and Zofran 4mg  given at 1934.   Pt called out again stating that she had no bowel control and had diarrhea constantly. She stated her other child had been sick and she fears that she caught whatever they had.   Donato Schultz, CNM was notified and she placed orders for a nasal swab.   Nurse swabbed pt and sent down via tube system at 2140.   Waiting on Imodium from pharmacy and will give to pt.   Pt's husband is coming to care for infant.

## 2023-03-15 NOTE — Lactation Note (Signed)
This note was copied from a baby's chart. Lactation Consultation Note  Patient Name: Amanda Fisher [id number] Date: 03/15/2023 Age:30 hours Reason for consult: Mother's request;Follow-up assessment;Difficult latch;Term   Maternal Data Patient requested lactation assistance w/ a latch.    Has patient been taught Hand Expression?: Yes Does the patient have breastfeeding experience prior to this delivery?: Yes How long did the patient breastfeed?: 11.5 months  Feeding Upon arrival in room, infant latched to lft breast, patient verbalized she is having a hard time getting infant to latch to rt breast.  Patient, took infant off lft breast, placed infant at the right breast.  LC assisted w/ positioning infant and he latched.  Minor adjustments made to bottom lip of infant but patient verbalized she felt sucking.  Mother's Current Feeding Choice: Breast Milk  LATCH Score Latch: Grasps breast easily, tongue down, lips flanged, rhythmical sucking.  Audible Swallowing: A few with stimulation  Type of Nipple: Everted at rest and after stimulation  Comfort (Breast/Nipple): Soft / non-tender  Hold (Positioning): Assistance needed to correctly position infant at breast and maintain latch.  LATCH Score: 8  Interventions Interventions: Assisted with latch;Breast compression;Adjust position;Support pillows  Discharge Discharge Education: Engorgement and breast care;Outpatient recommendation Pump:  (No, patient not interested in getting a breastpump.) WIC Program: Yes  Consult Status Consult Status: PRN Follow-up type: Call as needed    Yvette Rack Leith Szafranski 03/15/2023, 1:13 PM

## 2023-03-15 NOTE — Progress Notes (Signed)
Post Partum Day 1 Subjective: Doing well, no complaints.  Tolerating regular diet, pain with PO meds, voiding and ambulating without difficulty.  No CP SOB Fever,Chills, N/V or leg pain; denies nipple or breast pain, no HA change of vision, RUQ/epigastric pain  Objective: BP 111/76 (BP Location: Left Arm)   Pulse 67   Temp 97.8 F (36.6 C) (Oral)   Resp 18   Ht 5\' 3"  (1.6 m)   Wt 83.9 kg   LMP 05/20/2022 (Exact Date)   SpO2 99%   Breastfeeding Unknown   BMI 32.77 kg/m    Physical Exam:  General: NAD Breasts: soft/nontender  CV: RRR Pulm: nl effort, CTABL Abdomen: soft, NT, BS x 4 Perineum:  minimal edema, intact  Lochia: moderate Uterine Fundus: fundus firm and 1 fb below umbilicus DVT Evaluation: no cords, ttp LEs   Recent Labs    03/15/23 0047 03/15/23 0520  HGB 12.7 11.3*  HCT 39.5 34.6*  WBC 12.4* 9.6  PLT 172 155    Assessment/Plan: 32 y.o. [id number] postpartum day # 1  - Continue routine PP care - Lactation consult PRN - Discussed contraceptive options including implant, IUDs hormonal and non-hormonal, injection, pills/ring/patch, condoms, and NFP.  - Acute blood loss anemia, clinically insignificant - hemoglobin changed from 12.7 to 11.3, patient is asymptomatic, hemodynamically stable; start po ferrous sulfate BID with stool softeners  - GBS positive, untreated, precipitous delivery, not enough time to treat, Peds aware, will need to remain inpatient for 36-48hrs depending on Peds preference - Immunization status: all Imms up to date  Disposition: Does not desire Dc home today.   Janyce Llanos, CNM 03/15/2023 8:46 AM

## 2023-03-15 NOTE — Discharge Summary (Signed)
Postpartum Discharge Summary  Patient Name: Amanda Fisher DOB: 1990-10-20 MRN: [id number]  Date of admission: 03/14/2023 Delivery date:03/14/2023 Delivering provider: Chari Manning Date of discharge: 03/15/2023  Primary OB: [facility] [id number] last menstrual period was 05/20/2022 (exact date). EDC Estimated Date of Delivery: 03/19/23 Gestational Age at Delivery: [redacted]w[redacted]d   Admitting diagnosis: Precipitous delivery [O62.3] Intrauterine pregnancy: [redacted]w[redacted]d     Secondary diagnosis:   Principal Problem:   Precipitous delivery   Discharge Diagnosis: Term Pregnancy Delivered      Hospital course: Onset of Labor With Vaginal Delivery      32 y.o. yo [id number] at [redacted]w[redacted]d was admitted in Active Labor on 03/14/2023. Labor course was complicated by precipitous delivery, patient SROM'd on elevator prior to arriving to the unit for clear fluid and delivered within 15 mins. Patient had poor maternal effort with pushing due to have no pain relief. At a point refused to push or change positions for delivery or for provider and nurses to assess FHT's Membrane Rupture Time/Date: 11:45 PM,03/14/2023  Delivery Method:Vaginal, Spontaneous Operative Delivery:N/A Episiotomy: None Lacerations:  None Patient had a postpartum course complicated by ***.  She is ambulating, tolerating a regular diet, passing flatus, and urinating well. Patient is discharged home in stable condition on 03/15/23.  Newborn Data: Birth date:03/14/2023 Birth time:10:59 PM Gender:Female Living status:  Apgars:3 ,5  Weight:                                             Post partum procedures:{Postpartum procedures:23558} Complications: None Delivery Type: spontaneous vaginal delivery Anesthesia: non-pharmacological methods Placenta: spontaneous To Pathology: No   Prenatal Labs:  Prenatal Labs: Blood type/Rh A POS   Antibody screen Negative    Rubella immune    Varicella Immune  RPR NR    HBsAg Neg   Hep C NR    HIV NR    GC neg  Chlamydia neg  Genetic screening cfDNA negative   1 hour GTT 93  3 hour GTT NR  GBS Pos      Magnesium Sulfate received: No BMZ received: No Rhophylac:was not indicated MMR: was not indicated Varivax vaccine given: was not indicated - Tdap vaccine:  declined - Flu vaccine:  declined -RSV vaccine:declined  Transfusion:{Transfusion received:30440034}  Physical exam  Vitals:   03/14/23 2344  BP: 112/68  Pulse: 88  Resp: 16  Temp: 97.7 F (36.5 C)  TempSrc: Axillary  Weight: 83.9 kg  Height: 5\' 3"  (1.6 m)   General: {Exam; general:21111117} Lochia: {Desc; appropriate/inappropriate:30686::"appropriate"} Uterine Fundus: {Desc; firm/soft:30687} Perineum:***minimal edema/{OB Perineal assessment:24215} DVT Evaluation: {Exam; RJJ:8841660}  Labs: Lab Results  Component Value Date   WBC 13.2 (H) 12/07/2020   HGB 11.6 (L) 12/07/2020   HCT 36.3 12/07/2020   MCV 78.9 (L) 12/07/2020   PLT 164 12/07/2020      Latest Ref Rng & Units 12/02/2019    1:52 PM  CMP  Glucose 70 - 99 mg/dL 630   BUN 6 - 20 mg/dL 10   Creatinine 1.60 - 1.00 mg/dL 1.09   Sodium 323 - 557 mmol/L 136   Potassium 3.5 - 5.1 mmol/L 3.3   Chloride 98 - 111 mmol/L 102   CO2 22 - 32 mmol/L 24   Calcium 8.9 - 10.3 mg/dL 8.9    Edinburgh Score:    12/07/2020    7:26 AM  Edinburgh Postnatal Depression Scale  Screening Tool  I have been able to laugh and see the funny side of things. 0  I have looked forward with enjoyment to things. 0  I have blamed myself unnecessarily when things went wrong. 2  I have been anxious or worried for no good reason. 2  I have felt scared or panicky for no good reason. 2  Things have been getting on top of me. 1  I have been so unhappy that I have had difficulty sleeping. 0  I have felt sad or miserable. 1  I have been so unhappy that I have been crying. 0  The thought of harming myself has occurred to me. 0  Edinburgh Postnatal Depression Scale Total  8    Risk assessment for postpartum VTE and prophylactic treatment: Very high risk factors: None High risk factors: None Moderate risk factors: BMI 30-40 kg/m2  Postpartum VTE prophylaxis with LMWH not indicated  After visit meds:  Allergies as of 03/15/2023       Reactions   Amoxicillin-pot Clavulanate Anaphylaxis   Promethazine Other (See Comments)   Shaking, restless legs Other reaction(s): Other (See Comments) Twitchy limbs  Twitchy limbs  Shaking, restless legs Shaking, restless legs   Other    SEASONAL   Tamiflu [oseltamivir] Other (See Comments)   depression   Fluoxetine Rash     Med Rec must be completed prior to using this SMARTLINK***      Discharge home in stable condition Infant Feeding: {Baby feeding:23562} Infant Disposition:{CHL IP OB HOME WITH QMVHQI:69629} Discharge instruction: per After Visit Summary and Postpartum booklet. Activity: Advance as tolerated. Pelvic rest for 6 weeks.  Diet: routine diet Anticipated Birth Control:  Contraceptives: Vasectomy Postpartum Appointment:6 weeks Additional Postpartum F/U: Postpartum Depression checkup Future Appointments:No future appointments. Follow up Visit:  Plan:  Sarriah Lime was discharged to home in good condition. Follow-up appointment as directed.    Signed: *** Hit refresh and delete this line

## 2023-03-15 NOTE — Lactation Note (Signed)
This note was copied from a baby's chart. Lactation Consultation Note  Patient Name: Amanda Fisher [id number] Date: 03/15/2023 Age:30 hours Reason for consult: Initial assessment;Term   Maternal Data Initial assessment w/ a P3 patient and a 12hr old baby Amanda.  This was a precipitous vaginal delivery.  Feeding goal is breastfeeding.   Patient stated that she breastfed her other children for 11.38months.  Patient expresses that she thinks her milk supply is not enough when they reach toodlers.  No other questions or comments.  Patient does not have a pump and expressed that she was not interested in a pump.  Has patient been taught Hand Expression?: Yes Does the patient have breastfeeding experience prior to this delivery?: Yes How long did the patient breastfeed?: 11.5 months  Feeding Mother's Current Feeding Choice: Breast Milk  No feeding observed during this visit.  Interventions Interventions: Breast feeding basics reviewed;Education  LC provided education on the following;  milk production expectations, hunger cues, day 1/2 wet/dirty diapers, hand expression, cluster feeding, benefits of STS and arousing infant for a feeding.  Lactation informed patient of feeding infant at least 8 or more times w/in a 24hr period but not exceeding 3hrs. Patient verbalized understanding.   Discharge Discharge Education: Engorgement and breast care;Outpatient recommendation Pump:  (No, patient not interested in getting a breastpump.) WIC Program: Yes  Consult Status Consult Status: PRN Follow-up type: Call as needed    Yvette Rack Merissa Renwick 03/15/2023, 12:14 PM

## 2023-03-16 LAB — RESP PANEL BY RT-PCR (RSV, FLU A&B, COVID)  RVPGX2
Influenza A by PCR: NEGATIVE
Influenza B by PCR: NEGATIVE
Resp Syncytial Virus by PCR: NEGATIVE
SARS Coronavirus 2 by RT PCR: NEGATIVE

## 2023-03-16 MED ORDER — WITCH HAZEL-GLYCERIN EX PADS: 1.0000 | MEDICATED_PAD | CUTANEOUS | Status: AC | PRN

## 2023-03-16 MED ORDER — BENZOCAINE-MENTHOL 20-0.5 % EX AERO: 1.0000 | INHALATION_SPRAY | CUTANEOUS | Status: AC | PRN

## 2023-03-16 MED ORDER — ACETAMINOPHEN 325 MG PO TABS: 650.0000 mg | ORAL_TABLET | ORAL | Status: AC | PRN

## 2023-03-16 MED ORDER — LOPERAMIDE HCL 2 MG PO CAPS: 2.0000 mg | ORAL_CAPSULE | ORAL | 0 refills | Status: AC | PRN

## 2023-03-16 MED ORDER — COCONUT OIL OIL: 1.0000 | TOPICAL_OIL | Status: AC | PRN

## 2023-03-16 MED ORDER — DIBUCAINE (PERIANAL) 1 % EX OINT: 1.0000 | TOPICAL_OINTMENT | CUTANEOUS | Status: AC | PRN

## 2023-03-16 MED ORDER — LACTATED RINGERS IV BOLUS: 1000.0000 mL | Freq: Once | INTRAVENOUS | Status: DC

## 2023-03-16 MED ORDER — IBUPROFEN 600 MG PO TABS: 600.0000 mg | ORAL_TABLET | Freq: Four times a day (QID) | ORAL | 0 refills | Status: AC | PRN

## 2023-03-16 NOTE — Discharge Instructions (Signed)
Discharge Instructions:   If there are any new medications, they have been ordered and will be available for pickup at the listed pharmacy on your way home from the hospital.   Call office if you have any of the following: headache, visual changes, fever >101.0 F, chills, shortness of breath, breast concerns, excessive vaginal bleeding, incision drainage or problems, leg pain or redness, depression or any other concerns. If you have vaginal discharge with an odor, let your doctor know.   It is normal to bleed for up to 6 weeks. You should not soak through more than 1 pad in 1 hour. If you have a blood clot larger than your fist with continued bleeding, call your doctor.   Activity: Do not lift > 10 lbs for 6 weeks (do not lift anything heavier than your baby). No intercourse, tampons, swimming pools, hot tubs, baths (only showers) for 6 weeks.  No driving for 1-2 weeks. Continue prenatal vitamin, especially if breastfeeding. Increase calories and fluids (water) while breastfeeding.   Your milk will come in, in the next couple of days (right now it is colostrum). You may have a slight fever when your milk comes in, but it should go away on its own.  If it does not, and rises above 101 F please call the doctor. You will also feel achy and your breasts will be firm. They will also start to leak. If you are breastfeeding, continue as you have been and you can pump/express milk for comfort.   If you have too much milk, your breasts can become engorged, which could lead to mastitis. This is an infection of the milk ducts. It can be very painful and you will need to notify your doctor to obtain a prescription for antibiotics. You can also treat it with a shower or hot/cold compress.   For concerns about your baby, please call your pediatrician.  For breastfeeding concerns, the lactation consultant can be reached at [phone].   Postpartum blues (feelings of happy one minute and sad another minute)  are normal for the first few weeks but if it gets worse let your doctor know.   Congratulations! We enjoyed caring for you and your new bundle of joy!  

## 2023-03-16 NOTE — Progress Notes (Signed)
Nurse went in to check on pt at this time and she was sleeping. Infant asleep in bassinet at bedside.
